# Patient Record
Sex: Female | Born: 1990
Health system: Southern US, Community
[De-identification: ages and names within clinical notes are randomized; demographics above are authoritative.]

## PROBLEM LIST (undated history)

## (undated) ENCOUNTER — Inpatient Hospital Stay (HOSPITAL_COMMUNITY): Payer: Self-pay

## (undated) DIAGNOSIS — A749 Chlamydial infection, unspecified: Secondary | ICD-10-CM

## (undated) DIAGNOSIS — T7840XA Allergy, unspecified, initial encounter: Secondary | ICD-10-CM

## (undated) DIAGNOSIS — A549 Gonococcal infection, unspecified: Secondary | ICD-10-CM

## (undated) DIAGNOSIS — R519 Headache, unspecified: Secondary | ICD-10-CM

## (undated) DIAGNOSIS — R51 Headache: Secondary | ICD-10-CM

## (undated) HISTORY — PX: WISDOM TOOTH EXTRACTION: SHX21

## (undated) HISTORY — PX: TONSILLECTOMY: SUR1361

## (undated) HISTORY — DX: Allergy, unspecified, initial encounter: T78.40XA

---
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
## 2011-08-06 ENCOUNTER — Emergency Department (HOSPITAL_COMMUNITY)
Admission: EM | Admit: 2011-08-06 | Discharge: 2011-08-06 | Disposition: A | Discharge status: Home Health | Payer: Medicaid Other | Attending: Emergency Medicine | Admitting: Emergency Medicine

## 2011-08-06 ENCOUNTER — Encounter (HOSPITAL_COMMUNITY): Payer: Self-pay | Admitting: *Deleted

## 2011-08-06 DIAGNOSIS — R110 Nausea: Secondary | ICD-10-CM | POA: Insufficient documentation

## 2011-08-06 DIAGNOSIS — B9689 Other specified bacterial agents as the cause of diseases classified elsewhere: Secondary | ICD-10-CM | POA: Insufficient documentation

## 2011-08-06 DIAGNOSIS — N72 Inflammatory disease of cervix uteri: Secondary | ICD-10-CM | POA: Insufficient documentation

## 2011-08-06 DIAGNOSIS — R1032 Left lower quadrant pain: Secondary | ICD-10-CM | POA: Insufficient documentation

## 2011-08-06 DIAGNOSIS — N760 Acute vaginitis: Secondary | ICD-10-CM | POA: Insufficient documentation

## 2011-08-06 DIAGNOSIS — A499 Bacterial infection, unspecified: Secondary | ICD-10-CM | POA: Insufficient documentation

## 2011-08-06 DIAGNOSIS — Z79899 Other long term (current) drug therapy: Secondary | ICD-10-CM | POA: Insufficient documentation

## 2011-08-06 DIAGNOSIS — R10814 Left lower quadrant abdominal tenderness: Secondary | ICD-10-CM | POA: Insufficient documentation

## 2011-08-06 LAB — URINALYSIS, ROUTINE W REFLEX MICROSCOPIC
Bilirubin Urine: NEGATIVE
Ketones, ur: NEGATIVE mg/dL
Nitrite: NEGATIVE
Specific Gravity, Urine: 1.037 — ABNORMAL HIGH (ref 1.005–1.030)
Urobilinogen, UA: 0.2 mg/dL (ref 0.0–1.0)

## 2011-08-06 LAB — WET PREP, GENITAL

## 2011-08-06 MED ORDER — TRAMADOL HCL 50 MG PO TABS
50.0000 mg | ORAL_TABLET | Freq: Once | ORAL | Status: AC
Start: 2011-08-06 — End: 2011-08-06
  Administered 2011-08-06: 50 mg via ORAL
  Filled 2011-08-06: qty 1

## 2011-08-06 MED ORDER — METRONIDAZOLE 500 MG PO TABS: 500.0000 mg | ORAL_TABLET | Freq: Two times a day (BID) | ORAL | Status: AC

## 2011-08-06 MED ORDER — TRAMADOL HCL 50 MG PO TABS: 50.0000 mg | ORAL_TABLET | Freq: Four times a day (QID) | ORAL | Status: AC | PRN

## 2011-08-06 MED ORDER — AZITHROMYCIN 250 MG PO TABS
1000.0000 mg | ORAL_TABLET | Freq: Once | ORAL | Status: AC
  Administered 2011-08-06: 1000 mg via ORAL
  Filled 2011-08-06: qty 4

## 2011-08-06 MED ORDER — CEFTRIAXONE SODIUM 250 MG IJ SOLR
250.0000 mg | Freq: Once | INTRAMUSCULAR | Status: AC
  Administered 2011-08-06: 250 mg via INTRAMUSCULAR
  Filled 2011-08-06: qty 250

## 2011-08-06 NOTE — ED Provider Notes (Signed)
History     CSN: [id number]  Arrival date & time 08/06/11  4098   First MD Initiated Contact with Patient 08/06/11 [phone]      Chief Complaint  Patient presents with  . Abdominal Pain    (Consider location/radiation/quality/duration/timing/severity/associated sxs/prior treatment) HPI Comments: Patient presents emergency Department with a three-day history of pain in her left lower quadrant. She states that it's been intermittent but worse over the last 2 days. She denies any UTI symptoms. Denies any vaginal discharge. She just recently had her period which was normal for her. Denies a fevers or chills. She's had some nausea but no vomiting. She has had pain like this in the past at times on the left side at times on the right side. She describes as a dull achy pain in the left lower quadrant it is otherwise nonradiating  The history is provided by the patient.    No past medical history on file.  No past surgical history on file.  No family history on file.  History  Substance Use Topics  . Smoking status: Not on file  . Smokeless tobacco: Not on file  . Alcohol Use: Not on file    OB History    No data available      Review of Systems  Constitutional: Negative for fever, chills, diaphoresis and fatigue.  HENT: Negative for congestion, rhinorrhea and sneezing.   Eyes: Negative.   Respiratory: Negative for cough, chest tightness and shortness of breath.   Cardiovascular: Negative for chest pain and leg swelling.  Gastrointestinal: Positive for abdominal pain. Negative for nausea, vomiting, diarrhea and blood in stool.  Genitourinary: Negative for frequency, hematuria, flank pain, vaginal bleeding, vaginal discharge, difficulty urinating and pelvic pain.  Musculoskeletal: Negative for back pain and arthralgias.  Skin: Negative for rash.  Neurological: Negative for dizziness, speech difficulty, weakness, numbness and headaches.    Allergies  Review of patient's  allergies indicates no known allergies.  Home Medications   Current Outpatient Rx  Name Route Sig Dispense Refill  . METRONIDAZOLE 500 MG PO TABS Oral Take 1 tablet (500 mg total) by mouth 2 (two) times daily. 14 tablet 0  . TRAMADOL HCL 50 MG PO TABS Oral Take 1 tablet (50 mg total) by mouth every 6 (six) hours as needed for pain. 15 tablet 0    BP 116/65  Pulse 106  Temp(Src) 97.6 F (36.4 C) (Oral)  Resp 16  SpO2 100%  Physical Exam  Constitutional: She is oriented to person, place, and time. She appears well-developed and well-nourished.  HENT:  Head: Normocephalic and atraumatic.  Eyes: Pupils are equal, round, and reactive to light.  Neck: Normal range of motion. Neck supple.  Cardiovascular: Normal rate, regular rhythm and normal heart sounds.   Pulmonary/Chest: Effort normal and breath sounds normal. No respiratory distress. She has no wheezes. She has no rales. She exhibits no tenderness.  Abdominal: Soft. Bowel sounds are normal. There is tenderness. There is no rebound and no guarding.       Mild tenderness to the left lower quadrant. No peritoneal signs.  Genitourinary: Vaginal discharge found.       Slight vag discharge, white.  No CMT, slight left adnexal tenderness  Musculoskeletal: Normal range of motion. She exhibits no edema.  Lymphadenopathy:    She has no cervical adenopathy.  Neurological: She is alert and oriented to person, place, and time.  Skin: Skin is warm and dry. No rash noted.  Psychiatric: She has a  normal mood and affect.    ED Course  Procedures (including critical care time)  Results for orders placed during the hospital encounter of 08/06/11  URINALYSIS, ROUTINE W REFLEX MICROSCOPIC      Component Value Range   Color, Urine YELLOW  YELLOW    APPearance CLEAR  CLEAR    Specific Gravity, Urine 1.037 (*) 1.005 - 1.030    pH 6.5  5.0 - 8.0    Glucose, UA NEGATIVE  NEGATIVE (mg/dL)   Hgb urine dipstick NEGATIVE  NEGATIVE    Bilirubin  Urine NEGATIVE  NEGATIVE    Ketones, ur NEGATIVE  NEGATIVE (mg/dL)   Protein, ur NEGATIVE  NEGATIVE (mg/dL)   Urobilinogen, UA 0.2  0.0 - 1.0 (mg/dL)   Nitrite NEGATIVE  NEGATIVE    Leukocytes, UA NEGATIVE  NEGATIVE   PREGNANCY, URINE      Component Value Range   Preg Test, Ur NEGATIVE  NEGATIVE   WET PREP, GENITAL      Component Value Range   Yeast Wet Prep HPF POC NONE SEEN  NONE SEEN    Trich, Wet Prep NONE SEEN  NONE SEEN    Clue Cells Wet Prep HPF POC FEW (*) NONE SEEN    WBC, Wet Prep HPF POC FEW (*) NONE SEEN    No results found.   No results found.   1. Bacterial vaginosis   2. Cervicitis       MDM   patient with vaginal discharge and left side adnexal pain. The pain is very mild and is not suggestive of ovarian torsion. The clinical picture did not suggest tubo-ovarian abscess. It could possibly represent an ovarian cyst, however the patient is very comfortable at this point and I do not feel that emergent imaging is indicated tonight. I will go ahead and treat the patient for cervicitis refer to OB/GYN and return here as needed for worsening symptoms        Rolan Bucco, MD 08/06/11 [phone]

## 2011-08-06 NOTE — ED Notes (Signed)
Pt is C/O ABD pain starting in the far LUQ and radiating around to RUQ.

## 2011-08-06 NOTE — Discharge Instructions (Signed)
Cervicitis Cervicitis is a soreness and swelling (inflammation) of the cervix. Your cervix is located at the bottom of your uterus which opens up to the vagina.  CAUSES   Sexually transmitted infections (STIs).   Allergic reaction.   Medicines or birth control devices that are put in the vagina.   Injury to the cervix.   Bacterial infections.  SYMPTOMS  There may be no symptoms. If symptoms occur, they may include:  Grey, white, yellow, or bad smelling vaginal discharge.   Pain or itching of the area outside the vagina.   Painful sexual intercourse.   Lower abdominal or lower back pain, especially during intercourse.   Frequent urination.   Abnormal vaginal bleeding between periods, after sexual intercourse, or after menopause.   Pressure or a heavy feeling in the pelvis.  DIAGNOSIS  Diagnosis is made after a pelvic exam. Other tests may include:  Examination of any discharge under a microscope (wet prep).   A Pap test.  TREATMENT  Treatment will depend on the cause of cervicitis. If it is caused by an STI, both you and your partner will need to be treated. Antibiotic medicines will be given. HOME CARE INSTRUCTIONS   Do not have sexual intercourse until your caregiver says it is okay.   Do not have sexual intercourse until your partner has been treated if your cervicitis is caused by an STI.   Take your antibiotics as directed. Finish them even if you start to feel better.  SEEK IMMEDIATE MEDICAL CARE IF:   Your symptoms come back.   You have a fever.   You experience any problems that may be related to the medicine you are taking.  MAKE SURE YOU:   Understand these instructions.   Will watch your condition.   Will get help right away if you are not doing well or get worse.  Document Released: 05/30/2005 Document Revised: 02/09/2011 Document Reviewed: 12/27/2010 [facility] Patient Information 2012 [place], [place].Bacterial Vaginosis Bacterial vaginosis is an  infection of the vagina. A healthy vagina has many kinds of good germs (bacteria). Sometimes the number of good germs can change. This allows bad germs to move in and cause an infection. You may be given medicine (antibiotics) to treat the infection. Or, you may not need treatment at all. HOME CARE  Take your medicine as told. Finish them even if you start to feel better.   Do not have sex until you finish your medicine.   Do not douche.   Practice safe sex.   Tell your sex partner that you have an infection. They should see their doctor for treatment if they have problems.  GET HELP RIGHT AWAY IF:  You do not get better after 3 days of treatment.   You have grey fluid (discharge) coming from your vagina.   You have pain.   You have a temperature of 102 F (38.9 C) or higher.  MAKE SURE YOU:   Understand these instructions.   Will watch your condition.   Will get help right away if you are not doing well or get worse.  Document Released: 03/08/2008 Document Revised: 02/09/2011 Document Reviewed: 03/08/2008 [facility] Patient Information 2012 [place], [place].

## 2011-08-08 LAB — GC/CHLAMYDIA PROBE AMP, GENITAL: GC Probe Amp, Genital: NEGATIVE

## 2012-02-24 ENCOUNTER — Inpatient Hospital Stay (HOSPITAL_COMMUNITY)
Admission: AD | Admit: 2012-02-24 | Discharge: 2012-02-24 | Disposition: A | Discharge status: Home / Self Care | Payer: Medicaid Other | Source: Ambulatory Visit | Attending: Obstetrics & Gynecology | Admitting: Obstetrics & Gynecology

## 2012-02-24 ENCOUNTER — Encounter (HOSPITAL_COMMUNITY): Payer: Self-pay | Admitting: *Deleted

## 2012-02-24 DIAGNOSIS — N939 Abnormal uterine and vaginal bleeding, unspecified: Secondary | ICD-10-CM

## 2012-02-24 DIAGNOSIS — N926 Irregular menstruation, unspecified: Secondary | ICD-10-CM

## 2012-02-24 LAB — URINE MICROSCOPIC-ADD ON

## 2012-02-24 LAB — URINALYSIS, ROUTINE W REFLEX MICROSCOPIC
Leukocytes, UA: NEGATIVE
Nitrite: NEGATIVE
Specific Gravity, Urine: 1.02 (ref 1.005–1.030)
Urobilinogen, UA: 0.2 mg/dL (ref 0.0–1.0)

## 2012-02-24 LAB — WET PREP, GENITAL
Clue Cells Wet Prep HPF POC: NONE SEEN
WBC, Wet Prep HPF POC: NONE SEEN

## 2012-02-24 LAB — POCT PREGNANCY, URINE: Preg Test, Ur: NEGATIVE

## 2012-02-24 MED ORDER — NORGESTIMATE-ETH ESTRADIOL 0.25-35 MG-MCG PO TABS: 1.0000 | ORAL_TABLET | Freq: Every day | ORAL | Status: DC

## 2012-02-24 NOTE — MAU Note (Signed)
Patient states she has been having irregular periods since her last Depo about one to two years ago. Bleeding became heavy and abdominal pain got worse last night.

## 2012-02-24 NOTE — MAU Provider Note (Signed)
I was present for the exam and agree with above.  [place], CNM 02/24/2012 3:00 PM

## 2012-02-24 NOTE — MAU Provider Note (Signed)
History     CSN: [id number]  Arrival date and time: 02/24/12 1059   First Provider Initiated Contact with Patient 02/24/12 1209      Chief Complaint  Patient presents with  . Vaginal Bleeding  . Abdominal Pain   HPI  Patient is a 21 y.o. AA female presenting to the MAU today with a cc of irregular periods for the past 1+ year.  Patient states that since coming off of her Depo Provera about 1-1.5 years ago she has not had a regular period.  She had normal menstrual cycles prior to the initiation of Depo Provera.  Over the past few months she has had irregular vaginal bleeding in which she has a Period amount of bleeding for a couple of days, then no bleeding for 2-3 days followed by another episode of period bleeding.  She states that over the past few days the flow has increased in amount and she has had significant lower abdominal cramping/discomfort.  Patient endorses seeing clots in the blood sometimes.  She denies any dysuria, hematuria, urinary frequency, abnormal vaginal discharge.  She is currently sexually active with no contraceptive use.  Urine pregnancy was negative today.  She denies any concerns of STI nor any hx of STI.  OB History    Grav Para Term Preterm Abortions TAB SAB Ect Mult Living                  History reviewed. No pertinent past medical history.  Past Surgical History  Procedure Date  . Tonsillectomy     History reviewed. No pertinent family history.  History  Substance Use Topics  . Smoking status: Current Every Day Smoker  . Smokeless tobacco: Not on file  . Alcohol Use: No    Allergies: No Known Allergies  Prescriptions prior to admission  Medication Sig Dispense Refill  . Pseudoephedrine-Ibuprofen (ADVIL COLD & SINUS LIQUI-GELS) 30-200 MG CAPS Take 1 tablet by mouth 2 (two) times daily as needed. For congestion/headache        Review of Systems  Constitutional: Negative.   Respiratory: Negative.   Cardiovascular: Negative.     Gastrointestinal: Positive for abdominal pain. Negative for heartburn, nausea, vomiting, diarrhea, constipation, blood in stool and melena.  Genitourinary: Negative.   Musculoskeletal: Negative.    Physical Exam   Blood pressure 122/77, pulse 86, temperature 99.3 F (37.4 C), temperature source Oral, resp. rate 16, height 5' 6.5" (1.689 m), weight 74.39 kg (164 lb), last menstrual period 02/23/2012, SpO2 100.00%.  Physical Exam  Physical Exam  General - WD, WN AA female in NAD sitting comfortably on examination table  Heart - RRR; S1/S2 distinct without murmur; no S3/S4; no rubs, clicks or gallops  Lungs - CTAB; no use of accessory muscles noted  Abdomen - BS +; S/NT/ND; mild suprapubic discomfort with light and deep palpation Pelvic Exam - closed cervix; presence of blood within the vaginal canal and cervical vault; no adnexal masses or CMT with bimanual examination; No lesions or swelling of external genital structures. Skin - presence of a 2 cm boil on the left posterior thigh, adjacent to left buttock    MAU Course  Procedures  Labs: - UA -- positive blood - Urine Pregnancy -- negative - Vaginal Wet Prep -- normal - G/C -- results pending   Assessment and Plan  (1) Anovulatory Bleeding   -- Will prescribe 3 months of OCPs.  Patient given list of local OB/GYN providers and clinics.  Instructed to schedule appointment  with a provider for routine gynecological care and to get a loner prescriptions of OCPs  Marcelline Mates 02/24/2012, 12:30 PM

## 2012-02-27 LAB — GC/CHLAMYDIA PROBE AMP, GENITAL: Chlamydia, DNA Probe: NEGATIVE

## 2012-06-14 ENCOUNTER — Encounter (HOSPITAL_COMMUNITY): Payer: Self-pay | Admitting: Emergency Medicine

## 2012-06-14 ENCOUNTER — Emergency Department (HOSPITAL_COMMUNITY)
Admission: EM | Admit: 2012-06-14 | Discharge: 2012-06-14 | Disposition: A | Discharge status: Home / Self Care | Payer: Self-pay | Attending: Emergency Medicine | Admitting: Emergency Medicine

## 2012-06-14 DIAGNOSIS — R221 Localized swelling, mass and lump, neck: Secondary | ICD-10-CM | POA: Insufficient documentation

## 2012-06-14 DIAGNOSIS — F17200 Nicotine dependence, unspecified, uncomplicated: Secondary | ICD-10-CM | POA: Insufficient documentation

## 2012-06-14 DIAGNOSIS — T495X1A Poisoning by ophthalmological drugs and preparations, accidental (unintentional), initial encounter: Secondary | ICD-10-CM | POA: Insufficient documentation

## 2012-06-14 DIAGNOSIS — T6591XA Toxic effect of unspecified substance, accidental (unintentional), initial encounter: Secondary | ICD-10-CM

## 2012-06-14 DIAGNOSIS — Y9389 Activity, other specified: Secondary | ICD-10-CM | POA: Insufficient documentation

## 2012-06-14 DIAGNOSIS — T490X1A Poisoning by local antifungal, anti-infective and anti-inflammatory drugs, accidental (unintentional), initial encounter: Secondary | ICD-10-CM | POA: Insufficient documentation

## 2012-06-14 DIAGNOSIS — Z79899 Other long term (current) drug therapy: Secondary | ICD-10-CM | POA: Insufficient documentation

## 2012-06-14 DIAGNOSIS — Y929 Unspecified place or not applicable: Secondary | ICD-10-CM | POA: Insufficient documentation

## 2012-06-14 DIAGNOSIS — R220 Localized swelling, mass and lump, head: Secondary | ICD-10-CM | POA: Insufficient documentation

## 2012-06-14 NOTE — ED Provider Notes (Signed)
History     CSN: [id number]  Arrival date & time 06/14/12  0705   First MD Initiated Contact with Patient 06/14/12 [phone]      Chief Complaint  Patient presents with  . Abdominal Pain  . Oral Swelling    (Consider location/radiation/quality/duration/timing/severity/associated sxs/prior treatment) HPI Pt presents with c/o accidentally brushing her teeth with hydrocortisone cream.  She states she realized her mistake when she started brushing and spit it out of her mouth.  She did not swallow much if any of it.  She now c/o feeling that her gums are swelling and diffuse cramping abdominal pain.  No difficulty breathing or swallowing.  No vomiting. No treatment prior to arrival.  There are no other associated systemic symptoms, there are no other alleviating or modifying factors.   History reviewed. No pertinent past medical history.  Past Surgical History  Procedure Date  . Tonsillectomy     No family history on file.  History  Substance Use Topics  . Smoking status: Current Every Day Smoker  . Smokeless tobacco: Not on file  . Alcohol Use: No    OB History    Grav Para Term Preterm Abortions TAB SAB Ect Mult Living                  Review of Systems ROS reviewed and all otherwise negative except for mentioned in HPI  Allergies  Review of patient's allergies indicates no known allergies.  Home Medications   Current Outpatient Rx  Name  Route  Sig  Dispense  Refill  . NORGESTIMATE-ETH ESTRADIOL 0.25-35 MG-MCG PO TABS   Oral   Take 1 tablet by mouth daily.   1 Package   3   . PSEUDOEPHEDRINE-IBUPROFEN 30-200 MG PO CAPS   Oral   Take 1 tablet by mouth 2 (two) times daily as needed. For congestion/headache           BP 125/80  Pulse 94  Temp 97.9 F (36.6 C) (Oral)  Resp 16  SpO2 99%  LMP 05/09/2012 Vitals reviewed Physical Exam Physical Examination: General appearance - alert, well appearing, and in no distress Mental status - alert, oriented to  person, place, and time Eyes - no conjunctival injection, no scleral icterus Mouth - mucous membranes moist, pharynx normal without lesions Neck - supple, no significant adenopathy Chest - clear to auscultation, no wheezes, rales or rhonchi, symmetric air entry Heart - normal rate, regular rhythm, normal S1, S2, no murmurs, rubs, clicks or gallops Abdomen - soft, nontender, nondistended, no masses or organomegaly, nabs Extremities - peripheral pulses normal, no pedal edema, no clubbing or cyanosis Skin - normal coloration and turgor, no rashes  ED Course  Procedures (including critical care time)  Labs Reviewed - No data to display No results found.   1. Accidental ingestion of substance       MDM  Pt presenting with concern for irritation of mouth after brushing teeth with hydrocortisone. No vomiting, abdomen benign.  Low concern for toxic effects.  D/w poison control as well and they state there is no treatment necessary and this exposure was benign.  Pt reassured.  Discharged with strict return precautions.  Pt agreeable with plan.        Ethelda Chick, MD 06/14/12 [phone]

## 2012-06-14 NOTE — ED Notes (Signed)
States that she accidentally brushed her teeth with cortisone. Complaints of mouth swelling and abd pain. No swelling noted, no n/v

## 2012-09-26 ENCOUNTER — Inpatient Hospital Stay (HOSPITAL_COMMUNITY)
Admission: AD | Admit: 2012-09-26 | Discharge: 2012-09-26 | Disposition: A | Discharge status: Home / Self Care | Payer: Self-pay | Source: Ambulatory Visit | Attending: Obstetrics and Gynecology | Admitting: Obstetrics and Gynecology

## 2012-09-26 ENCOUNTER — Encounter (HOSPITAL_COMMUNITY): Payer: Self-pay | Admitting: *Deleted

## 2012-09-26 DIAGNOSIS — N912 Amenorrhea, unspecified: Secondary | ICD-10-CM | POA: Insufficient documentation

## 2012-09-26 DIAGNOSIS — Z3202 Encounter for pregnancy test, result negative: Secondary | ICD-10-CM | POA: Insufficient documentation

## 2012-09-26 HISTORY — DX: Chlamydial infection, unspecified: A74.9

## 2012-09-26 HISTORY — DX: Gonococcal infection, unspecified: A54.9

## 2012-09-26 LAB — POCT PREGNANCY, URINE: Preg Test, Ur: NEGATIVE

## 2012-09-26 NOTE — MAU Note (Signed)
Patient states she is unsure of her last period, has been "awhile". Denies pain or bleeding.

## 2012-09-26 NOTE — Progress Notes (Signed)
States she does not need anything today except pregnancy test. Declines pelvic exam.

## 2012-09-26 NOTE — MAU Provider Note (Signed)
  History     CSN: [id number]  Arrival date and time: 09/26/12 1444   First Provider Initiated Contact with Patient 09/26/12 1522      Chief Complaint  Patient presents with  . Possible Pregnancy   HPI Emily Lowery is 22 y.o. G0P0 Unknown weeks presenting with request for pregnancy test.  LMP ?  Maybe several months ago.  Hx of irregular cycles.  Sexually active, condoms.  Tired a lot.  Denies breast tenderness.  Nausea occ.     Past Medical History  Diagnosis Date  . Gonorrhea   . Chlamydia     Past Surgical History  Procedure Laterality Date  . Tonsillectomy    . Wisdom tooth extraction      History reviewed. No pertinent family history.  History  Substance Use Topics  . Smoking status: Current Every Day Smoker    Types: Cigarettes  . Smokeless tobacco: Not on file  . Alcohol Use: Yes     Comment: occas.    Allergies: No Known Allergies  Prescriptions prior to admission  Medication Sig Dispense Refill  . norgestimate-ethinyl estradiol (SPRINTEC 28) 0.25-35 MG-MCG tablet Take 1 tablet by mouth daily.  1 Package  3  . Pseudoephedrine-Ibuprofen (ADVIL COLD & SINUS LIQUI-GELS) 30-200 MG CAPS Take 1 tablet by mouth 2 (two) times daily as needed. For congestion/headache        Review of Systems  Constitutional: Positive for malaise/fatigue.  Gastrointestinal: Positive for nausea (occ). Negative for vomiting and abdominal pain.  Genitourinary:       Amenorrhea   Physical Exam   Blood pressure 120/76, pulse 89, temperature 98.1 F (36.7 C), temperature source Oral, resp. rate 16, SpO2 100.00%.  Physical Exam  Constitutional: She is oriented to person, place, and time. She appears well-developed and well-nourished. No distress.  HENT:  Head: Normocephalic.  Neurological: She is alert and oriented to person, place, and time.  Psychiatric: She has a normal mood and affect. Her behavior is normal.   Results for orders placed during the hospital encounter of  09/26/12 (from the past 24 hour(s))  POCT PREGNANCY, URINE     Status: None   Collection Time    09/26/12  3:06 PM      Result Value Range   Preg Test, Ur NEGATIVE  NEGATIVE   MAU Course  Procedures  MDM  Patient states she is using condoms for birth control.  Depo and OCPs were not good options for her.   Assessment and Plan  A:  Amenorrhea     Negative pregnancy test  P:  Continue condoms       Encouraged patient to find doctor for routine GYN care  Krew Hortman,EVE M 09/26/2012, 3:24 PM

## 2012-09-27 NOTE — MAU Provider Note (Signed)
Attestation of Attending Supervision of Advanced Practitioner (CNM/NP): Evaluation and management procedures were performed by the Advanced Practitioner under my supervision and collaboration.  I have reviewed the Advanced Practitioner's note and chart, and I agree with the management and plan.  Ravleen Ries 09/27/2012 12:56 PM

## 2013-03-25 ENCOUNTER — Emergency Department (HOSPITAL_COMMUNITY)
Admission: EM | Admit: 2013-03-25 | Discharge: 2013-03-25 | Disposition: A | Discharge status: Home / Self Care | Payer: BC Managed Care – PPO | Attending: Emergency Medicine | Admitting: Emergency Medicine

## 2013-03-25 ENCOUNTER — Encounter (HOSPITAL_COMMUNITY): Payer: Self-pay | Admitting: Emergency Medicine

## 2013-03-25 DIAGNOSIS — R109 Unspecified abdominal pain: Secondary | ICD-10-CM

## 2013-03-25 DIAGNOSIS — R110 Nausea: Secondary | ICD-10-CM | POA: Insufficient documentation

## 2013-03-25 DIAGNOSIS — R51 Headache: Secondary | ICD-10-CM | POA: Insufficient documentation

## 2013-03-25 DIAGNOSIS — F17200 Nicotine dependence, unspecified, uncomplicated: Secondary | ICD-10-CM | POA: Insufficient documentation

## 2013-03-25 DIAGNOSIS — Z3202 Encounter for pregnancy test, result negative: Secondary | ICD-10-CM | POA: Insufficient documentation

## 2013-03-25 DIAGNOSIS — H53149 Visual discomfort, unspecified: Secondary | ICD-10-CM | POA: Insufficient documentation

## 2013-03-25 DIAGNOSIS — Z8619 Personal history of other infectious and parasitic diseases: Secondary | ICD-10-CM | POA: Insufficient documentation

## 2013-03-25 LAB — URINALYSIS, ROUTINE W REFLEX MICROSCOPIC
Bilirubin Urine: NEGATIVE
Ketones, ur: NEGATIVE mg/dL
Nitrite: NEGATIVE
Urobilinogen, UA: 1 mg/dL (ref 0.0–1.0)
pH: 6 (ref 5.0–8.0)

## 2013-03-25 LAB — URINE MICROSCOPIC-ADD ON

## 2013-03-25 MED ORDER — OXYCODONE-ACETAMINOPHEN 5-325 MG PO TABS
1.0000 | ORAL_TABLET | Freq: Once | ORAL | Status: AC
  Administered 2013-03-25: 1 via ORAL
  Filled 2013-03-25: qty 1

## 2013-03-25 MED ORDER — SODIUM CHLORIDE 0.9 % IV BOLUS (SEPSIS)
1000.0000 mL | Freq: Once | INTRAVENOUS | Status: AC
  Administered 2013-03-25: 1000 mL via INTRAVENOUS

## 2013-03-25 MED ORDER — IBUPROFEN 600 MG PO TABS: 600.0000 mg | ORAL_TABLET | Freq: Four times a day (QID) | ORAL | Status: DC | PRN

## 2013-03-25 MED ORDER — ONDANSETRON 8 MG PO TBDP: 8.0000 mg | ORAL_TABLET | Freq: Three times a day (TID) | ORAL | Status: DC | PRN

## 2013-03-25 MED ORDER — ONDANSETRON HCL 4 MG/2ML IJ SOLN
4.0000 mg | Freq: Once | INTRAMUSCULAR | Status: AC
  Administered 2013-03-25: 4 mg via INTRAVENOUS
  Filled 2013-03-25: qty 2

## 2013-03-25 NOTE — ED Notes (Signed)
Pt states that migraine, nausea, abd pain and dry mouth started yesterday.  Pt states that she has had ginger ale.

## 2013-03-25 NOTE — ED Provider Notes (Signed)
CSN: [id number]     Arrival date & time 03/25/13  0865 History   First MD Initiated Contact with Patient 03/25/13 1014     Chief Complaint  Patient presents with  . Migraine  . Abdominal Pain  . Nausea    The history is provided by the patient.   patient reports developing nausea and intermittent lower abdominal pain as well as dry mouth with associated migraine headache over the past 24 hours.  She denies fevers and chills.  She reports she has a history of headaches and states this feels similar.  She has some photophobia.  She reports nausea without vomiting.  She denies melena or hematochezia.  No hematemesis.  She reports intermittent lower abdominal pain.  She denies dysuria or urinary frequency.  No change in her bowel movements.  She denies upper abdominal pain.  She states sometimes lower abdominal pain seems to worsen by food.  Her symptoms are mild in severity.  She states at this time she has a significant abdominal pain  Past Medical History  Diagnosis Date  . Gonorrhea   . Chlamydia    Past Surgical History  Procedure Laterality Date  . Tonsillectomy    . Wisdom tooth extraction     No family history on file. History  Substance Use Topics  . Smoking status: Current Every Day Smoker    Types: Cigarettes  . Smokeless tobacco: Not on file  . Alcohol Use: Yes     Comment: occas.   OB History   Grav Para Term Preterm Abortions TAB SAB Ect Mult Living   0              Review of Systems  All other systems reviewed and are negative.    Allergies  Review of patient's allergies indicates no known allergies.  Home Medications   Current Outpatient Rx  Name  Route  Sig  Dispense  Refill  . ibuprofen (ADVIL,MOTRIN) 600 MG tablet   Oral   Take 1 tablet (600 mg total) by mouth every 6 (six) hours as needed for pain.   30 tablet   0   . ondansetron (ZOFRAN ODT) 8 MG disintegrating tablet   Oral   Take 1 tablet (8 mg total) by mouth every 8 (eight) hours as needed  for nausea.   10 tablet   0    BP 118/64  Pulse 87  Temp(Src) 98.4 F (36.9 C) (Oral)  Resp 16  SpO2 100%  LMP 03/18/2013 Physical Exam  Nursing note and vitals reviewed. Constitutional: She is oriented to person, place, and time. She appears well-developed and well-nourished. No distress.  HENT:  Head: Normocephalic and atraumatic.  Eyes: EOM are normal.  Neck: Normal range of motion.  Cardiovascular: Normal rate, regular rhythm and normal heart sounds.   Pulmonary/Chest: Effort normal and breath sounds normal.  Abdominal: Soft. She exhibits no distension. There is no tenderness.  Musculoskeletal: Normal range of motion.  Neurological: She is alert and oriented to person, place, and time.  Skin: Skin is warm and dry.  Psychiatric: She has a normal mood and affect. Judgment normal.    ED Course  Procedures (including critical care time) Labs Review Labs Reviewed  URINALYSIS, ROUTINE W REFLEX MICROSCOPIC - Abnormal; Notable for the following:    APPearance CLOUDY (*)    Specific Gravity, Urine 1.037 (*)    Leukocytes, UA SMALL (*)    All other components within normal limits  URINE MICROSCOPIC-ADD ON - Abnormal; Notable  for the following:    Squamous Epithelial / LPF FEW (*)    Bacteria, UA FEW (*)    All other components within normal limits  URINE CULTURE  PREGNANCY, URINE   Imaging Review No results found.  EKG Interpretation   None       MDM   1. Abdominal pain   2. Headache    11:41 AM  Patient feels much better this time.  Discharge home.  Urine without signs of infection.  Headache improved.  Normal neurologic exam.  No indication for imaging.  Repeat abdominal exam is benign.    Lyanne Co, MD 03/25/13 [phone]

## 2013-03-26 LAB — URINE CULTURE

## 2013-09-25 ENCOUNTER — Encounter (HOSPITAL_COMMUNITY): Payer: Self-pay | Admitting: Emergency Medicine

## 2013-09-25 ENCOUNTER — Emergency Department (HOSPITAL_COMMUNITY): Payer: Medicaid Other

## 2013-09-25 ENCOUNTER — Emergency Department (HOSPITAL_COMMUNITY)
Admission: EM | Admit: 2013-09-25 | Discharge: 2013-09-25 | Disposition: A | Discharge status: Home / Self Care | Payer: Medicaid Other | Attending: Emergency Medicine | Admitting: Emergency Medicine

## 2013-09-25 DIAGNOSIS — S335XXA Sprain of ligaments of lumbar spine, initial encounter: Secondary | ICD-10-CM | POA: Insufficient documentation

## 2013-09-25 DIAGNOSIS — Y92410 Unspecified street and highway as the place of occurrence of the external cause: Secondary | ICD-10-CM | POA: Insufficient documentation

## 2013-09-25 DIAGNOSIS — Y9389 Activity, other specified: Secondary | ICD-10-CM | POA: Insufficient documentation

## 2013-09-25 DIAGNOSIS — S139XXA Sprain of joints and ligaments of unspecified parts of neck, initial encounter: Secondary | ICD-10-CM | POA: Insufficient documentation

## 2013-09-25 DIAGNOSIS — F17200 Nicotine dependence, unspecified, uncomplicated: Secondary | ICD-10-CM | POA: Insufficient documentation

## 2013-09-25 DIAGNOSIS — S39012A Strain of muscle, fascia and tendon of lower back, initial encounter: Secondary | ICD-10-CM

## 2013-09-25 DIAGNOSIS — Z8619 Personal history of other infectious and parasitic diseases: Secondary | ICD-10-CM | POA: Insufficient documentation

## 2013-09-25 DIAGNOSIS — S161XXA Strain of muscle, fascia and tendon at neck level, initial encounter: Secondary | ICD-10-CM

## 2013-09-25 MED ORDER — HYDROCODONE-ACETAMINOPHEN 5-325 MG PO TABS
1.0000 | ORAL_TABLET | Freq: Once | ORAL | Status: AC
  Administered 2013-09-25: 1 via ORAL
  Filled 2013-09-25: qty 1

## 2013-09-25 MED ORDER — NAPROXEN 500 MG PO TABS: 500.0000 mg | ORAL_TABLET | Freq: Two times a day (BID) | ORAL | Status: DC

## 2013-09-25 MED ORDER — CYCLOBENZAPRINE HCL 10 MG PO TABS: 10.0000 mg | ORAL_TABLET | Freq: Two times a day (BID) | ORAL | Status: DC | PRN

## 2013-09-25 MED ORDER — HYDROCODONE-ACETAMINOPHEN 5-325 MG PO TABS: 1.0000 | ORAL_TABLET | ORAL | Status: DC | PRN

## 2013-09-25 MED ORDER — IBUPROFEN 800 MG PO TABS
800.0000 mg | ORAL_TABLET | Freq: Once | ORAL | Status: AC
Start: 2013-09-25 — End: 2013-09-25
  Administered 2013-09-25: 800 mg via ORAL
  Filled 2013-09-25: qty 1
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
## 2013-09-25 NOTE — Progress Notes (Signed)
[facility] provided pt with a list of primary care resources to help patient establish primary care.  °
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
## 2013-09-25 NOTE — ED Provider Notes (Signed)
Medical screening examination/treatment/procedure(s) were performed by non-physician practitioner and as supervising physician I was immediately available for consultation/collaboration.   EKG Interpretation None       Ethelda ChickMartha K Linker, MD 09/25/13 1556

## 2014-05-06 ENCOUNTER — Encounter (HOSPITAL_COMMUNITY): Payer: Self-pay | Admitting: Emergency Medicine

## 2014-05-06 ENCOUNTER — Emergency Department (HOSPITAL_COMMUNITY)
Admission: EM | Admit: 2014-05-06 | Discharge: 2014-05-06 | Disposition: A | Discharge status: Home / Self Care | Payer: No Typology Code available for payment source | Attending: Emergency Medicine | Admitting: Emergency Medicine

## 2014-05-06 DIAGNOSIS — Y9289 Other specified places as the place of occurrence of the external cause: Secondary | ICD-10-CM | POA: Insufficient documentation

## 2014-05-06 DIAGNOSIS — Y9389 Activity, other specified: Secondary | ICD-10-CM | POA: Diagnosis not present

## 2014-05-06 DIAGNOSIS — Z720 Tobacco use: Secondary | ICD-10-CM | POA: Insufficient documentation

## 2014-05-06 DIAGNOSIS — X12XXXA Contact with other hot fluids, initial encounter: Secondary | ICD-10-CM | POA: Diagnosis not present

## 2014-05-06 DIAGNOSIS — Z8619 Personal history of other infectious and parasitic diseases: Secondary | ICD-10-CM | POA: Diagnosis not present

## 2014-05-06 DIAGNOSIS — T23161A Burn of first degree of back of right hand, initial encounter: Secondary | ICD-10-CM | POA: Diagnosis not present

## 2014-05-06 DIAGNOSIS — T23101A Burn of first degree of right hand, unspecified site, initial encounter: Secondary | ICD-10-CM

## 2014-05-06 DIAGNOSIS — Z791 Long term (current) use of non-steroidal anti-inflammatories (NSAID): Secondary | ICD-10-CM | POA: Insufficient documentation

## 2014-05-06 DIAGNOSIS — Z23 Encounter for immunization: Secondary | ICD-10-CM | POA: Diagnosis not present

## 2014-05-06 DIAGNOSIS — Y990 Civilian activity done for income or pay: Secondary | ICD-10-CM | POA: Diagnosis not present

## 2014-05-06 DIAGNOSIS — T23001A Burn of unspecified degree of right hand, unspecified site, initial encounter: Secondary | ICD-10-CM | POA: Diagnosis present

## 2014-05-06 MED ORDER — ACETAMINOPHEN 500 MG PO TABS: 500.0000 mg | ORAL_TABLET | Freq: Four times a day (QID) | ORAL | Status: DC | PRN

## 2014-05-06 MED ORDER — NAPROXEN 500 MG PO TABS: 500.0000 mg | ORAL_TABLET | Freq: Two times a day (BID) | ORAL | Status: DC

## 2014-05-06 MED ORDER — TETANUS-DIPHTH-ACELL PERTUSSIS 5-2.5-18.5 LF-MCG/0.5 IM SUSP
0.5000 mL | Freq: Once | INTRAMUSCULAR | Status: AC
  Administered 2014-05-06: 0.5 mL via INTRAMUSCULAR
  Filled 2014-05-06: qty 0.5
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
## 2014-07-13 ENCOUNTER — Encounter (HOSPITAL_COMMUNITY): Payer: Self-pay | Admitting: *Deleted

## 2014-07-13 ENCOUNTER — Inpatient Hospital Stay (HOSPITAL_COMMUNITY)
Admission: AD | Admit: 2014-07-13 | Discharge: 2014-07-13 | Disposition: A | Discharge status: Home / Self Care | Payer: No Typology Code available for payment source | Source: Ambulatory Visit | Attending: Family Medicine | Admitting: Family Medicine

## 2014-07-13 DIAGNOSIS — N760 Acute vaginitis: Secondary | ICD-10-CM

## 2014-07-13 DIAGNOSIS — Z87891 Personal history of nicotine dependence: Secondary | ICD-10-CM | POA: Insufficient documentation

## 2014-07-13 DIAGNOSIS — A499 Bacterial infection, unspecified: Secondary | ICD-10-CM

## 2014-07-13 DIAGNOSIS — B9689 Other specified bacterial agents as the cause of diseases classified elsewhere: Secondary | ICD-10-CM | POA: Diagnosis not present

## 2014-07-13 DIAGNOSIS — N939 Abnormal uterine and vaginal bleeding, unspecified: Secondary | ICD-10-CM | POA: Diagnosis present

## 2014-07-13 LAB — URINALYSIS, ROUTINE W REFLEX MICROSCOPIC
Bilirubin Urine: NEGATIVE
GLUCOSE, UA: NEGATIVE mg/dL
Ketones, ur: NEGATIVE mg/dL
LEUKOCYTES UA: NEGATIVE
Nitrite: NEGATIVE
PH: 5.5 (ref 5.0–8.0)
Protein, ur: NEGATIVE mg/dL
Urobilinogen, UA: 0.2 mg/dL (ref 0.0–1.0)

## 2014-07-13 LAB — URINE MICROSCOPIC-ADD ON

## 2014-07-13 LAB — WET PREP, GENITAL
TRICH WET PREP: NONE SEEN
Yeast Wet Prep HPF POC: NONE SEEN

## 2014-07-13 LAB — POCT PREGNANCY, URINE: PREG TEST UR: NEGATIVE

## 2014-07-13 MED ORDER — METRONIDAZOLE 500 MG PO TABS: 500.0000 mg | ORAL_TABLET | Freq: Two times a day (BID) | ORAL | Status: DC

## 2014-07-13 NOTE — MAU Note (Signed)
Pt fell asleep in Triage while obtaining information. Easily arouses

## 2014-07-13 NOTE — MAU Provider Note (Signed)
History     CSN: [id number]  Arrival date and time: 07/13/14 0307   First Provider Initiated Contact with Patient 07/13/14 [phone]      Chief Complaint  Patient presents with  . Vaginal Bleeding   HPI  24 yo female here with report of intermittent white discharge and vaginal itching x two days.  Reports smelling slight odor.  Also reports bleeding that started one week ago, lasted two days that returned yesterday.  ;Bleeding is described as light.  Also reports occasional mid pelvic cramping.   Past Medical History  Diagnosis Date  . Gonorrhea   . Chlamydia   . Medical history non-contributory     Past Surgical History  Procedure Laterality Date  . Tonsillectomy    . Wisdom tooth extraction      Family History  Problem Relation Age of Onset  . Diabetes Mother   . Diabetes Sister   . Diabetes Maternal Grandmother     History  Substance Use Topics  . Smoking status: Former Smoker    Types: Cigarettes  . Smokeless tobacco: Not on file  . Alcohol Use: Yes     Comment: occas.    Allergies: No Known Allergies  Prescriptions prior to admission  Medication Sig Dispense Refill Last Dose  . acetaminophen (TYLENOL) 500 MG tablet Take 1 tablet (500 mg total) by mouth every 6 (six) hours as needed. 30 tablet 0 Past Month at Unknown time  . cyclobenzaprine (FLEXERIL) 10 MG tablet Take 1 tablet (10 mg total) by mouth 2 (two) times daily as needed for muscle spasms. 20 tablet 0   . HYDROcodone-acetaminophen (NORCO/VICODIN) 5-325 MG per tablet Take 1 tablet by mouth every 4 (four) hours as needed. 15 tablet 0   . ibuprofen (ADVIL,MOTRIN) 600 MG tablet Take 1 tablet (600 mg total) by mouth every 6 (six) hours as needed for pain. 30 tablet 0 More than a month at Unknown time  . naproxen (NAPROSYN) 500 MG tablet Take 1 tablet (500 mg total) by mouth 2 (two) times daily. 30 tablet 0   . ondansetron (ZOFRAN ODT) 8 MG disintegrating tablet Take 1 tablet (8 mg total) by mouth every 8  (eight) hours as needed for nausea. 10 tablet 0 More than a month at Unknown time    Review of Systems  Gastrointestinal: Positive for abdominal pain (craming).  Genitourinary:       Vaginal discharge  All other systems reviewed and are negative.  Physical Exam   Blood pressure 114/67, pulse 110, temperature 98.7 F (37.1 C), resp. rate 18, height  (1.676 m), weight 78.2 kg (172 lb 6.4 oz), last menstrual period 06/23/2014, SpO2 99 %.  Physical Exam  Constitutional: She is oriented to person, place, and time. She appears well-developed and well-nourished. No distress.  HENT:  Head: Normocephalic.  Eyes: Pupils are equal, round, and reactive to light.  Neck: Normal range of motion. Neck supple.  Cardiovascular: Normal rate, regular rhythm and normal heart sounds.   Respiratory: Effort normal and breath sounds normal.  GI: Soft. She exhibits no mass. There is no tenderness. There is no guarding.  Genitourinary: No bleeding in the vagina. Vaginal discharge (thin, white) found.  Negative cervical motion tenderness  Neurological: She is alert and oriented to person, place, and time. She has normal reflexes.  Skin: Skin is warm and dry.    MAU Course  Procedures  Results for orders placed or performed during the hospital encounter of 07/13/14 (from the past 24  hour(s))  Urinalysis, Routine w reflex microscopic     Status: Abnormal   Collection Time: 07/13/14  3:30 AM  Result Value Ref Range   Color, Urine YELLOW YELLOW   APPearance CLEAR CLEAR   Specific Gravity, Urine <1.005 (L) 1.005 - 1.030   pH 5.5 5.0 - 8.0   Glucose, UA NEGATIVE NEGATIVE mg/dL   Hgb urine dipstick SMALL (A) NEGATIVE   Bilirubin Urine NEGATIVE NEGATIVE   Ketones, ur NEGATIVE NEGATIVE mg/dL   Protein, ur NEGATIVE NEGATIVE mg/dL   Urobilinogen, UA 0.2 0.0 - 1.0 mg/dL   Nitrite NEGATIVE NEGATIVE   Leukocytes, UA NEGATIVE NEGATIVE  Urine microscopic-add on     Status: None   Collection Time: 07/13/14   3:30 AM  Result Value Ref Range   Squamous Epithelial / LPF RARE RARE   WBC, UA 0-2 <3 WBC/hpf   RBC / HPF 0-2 <3 RBC/hpf   Bacteria, UA RARE RARE  Pregnancy, urine POC     Status: None   Collection Time: 07/13/14  3:39 AM  Result Value Ref Range   Preg Test, Ur NEGATIVE NEGATIVE  Wet prep, genital     Status: Abnormal   Collection Time: 07/13/14  3:47 AM  Result Value Ref Range   Yeast Wet Prep HPF POC NONE SEEN NONE SEEN   Trich, Wet Prep NONE SEEN NONE SEEN   Clue Cells Wet Prep HPF POC MODERATE (A) NONE SEEN   WBC, Wet Prep HPF POC MODERATE (A) NONE SEEN     Assessment and Plan  Bacterial Vaginosis  Plan: Discharge to home RX Flagyl 500 mg BID x 7 days HIV and GC/CT pending   Rochele PagesKARIM, Athziri Freundlich N 07/13/2014, 3:34 AM

## 2014-07-13 NOTE — Progress Notes (Signed)
Written and verbal d/c instructions given and understanding voiced. 

## 2014-07-13 NOTE — MAU Note (Signed)
I think i have an infection. Have had period twice this month. Had it earlier this month and started bleeding today.Occ abd cramping. Some white d/c before bleeding started

## 2014-07-13 NOTE — Discharge Instructions (Signed)
Bacterial Vaginosis Bacterial vaginosis is a vaginal infection that occurs when the normal balance of bacteria in the vagina is disrupted. It results from an overgrowth of certain bacteria. This is the most common vaginal infection in women of childbearing age. Treatment is important to prevent complications, especially in pregnant women, as it can cause a premature delivery. CAUSES  Bacterial vaginosis is caused by an increase in harmful bacteria that are normally present in smaller amounts in the vagina. Several different kinds of bacteria can cause bacterial vaginosis. However, the reason that the condition develops is not fully understood. RISK FACTORS Certain activities or behaviors can put you at an increased risk of developing bacterial vaginosis, including:  Having a new sex partner or multiple sex partners.  Douching.  Using an intrauterine device (IUD) for contraception. Women do not get bacterial vaginosis from toilet seats, bedding, swimming pools, or contact with objects around them. SIGNS AND SYMPTOMS  Some women with bacterial vaginosis have no signs or symptoms. Common symptoms include:  Grey vaginal discharge.  A fishlike odor with discharge, especially after sexual intercourse.  Itching or burning of the vagina and vulva.  Burning or pain with urination. DIAGNOSIS  Your health care provider will take a medical history and examine the vagina for signs of bacterial vaginosis. A sample of vaginal fluid may be taken. Your health care provider will look at this sample under a microscope to check for bacteria and abnormal cells. A vaginal pH test may also be done.  TREATMENT  Bacterial vaginosis may be treated with antibiotic medicines. These may be given in the form of a pill or a vaginal cream. A second round of antibiotics may be prescribed if the condition comes back after treatment.  HOME CARE INSTRUCTIONS   Only take over-the-counter or prescription medicines as  directed by your health care provider.  If antibiotic medicine was prescribed, take it as directed. Make sure you finish it even if you start to feel better.  Do not have sex until treatment is completed.  Tell all sexual partners that you have a vaginal infection. They should see their health care provider and be treated if they have problems, such as a mild rash or itching.  Practice safe sex by using condoms and only having one sex partner. SEEK MEDICAL CARE IF:   Your symptoms are not improving after 3 days of treatment.  You have increased discharge or pain.  You have a fever. MAKE SURE YOU:   Understand these instructions.  Will watch your condition.  Will get help right away if you are not doing well or get worse. FOR MORE INFORMATION  Centers for Disease Control and Prevention, Division of STD Prevention: www.cdc.gov/std American Sexual Health Association (ASHA): www.ashastd.org  Document Released: 05/30/2005 Document Revised: 03/20/2013 Document Reviewed: 01/09/2013 ExitCare Patient Information 2015 [employer]. This information is not intended to replace advice given to you by your health care provider. Make sure you discuss any questions you have with your health care provider.  

## 2014-07-13 NOTE — MAU Note (Signed)
Pt called friend to come and pick her up. Pt not driving

## 2014-07-14 ENCOUNTER — Other Ambulatory Visit: Payer: Self-pay | Admitting: Family Medicine

## 2014-07-14 LAB — GC/CHLAMYDIA PROBE AMP (~~LOC~~) NOT AT ARMC
Chlamydia: POSITIVE — AB
Neisseria Gonorrhea: NEGATIVE

## 2014-07-14 MED ORDER — AZITHROMYCIN 250 MG PO TABS: 1000.0000 mg | ORAL_TABLET | Freq: Once | ORAL | Status: DC

## 2014-07-15 LAB — HIV ANTIBODY (ROUTINE TESTING W REFLEX): HIV SCREEN 4TH GENERATION: NONREACTIVE

## 2014-08-16 ENCOUNTER — Encounter (HOSPITAL_COMMUNITY): Payer: Self-pay | Admitting: *Deleted

## 2014-08-16 ENCOUNTER — Inpatient Hospital Stay (HOSPITAL_COMMUNITY)
Admission: AD | Admit: 2014-08-16 | Discharge: 2014-08-16 | Disposition: A | Discharge status: Home / Self Care | Payer: No Typology Code available for payment source | Source: Ambulatory Visit | Attending: Family Medicine | Admitting: Family Medicine

## 2014-08-16 DIAGNOSIS — A5901 Trichomonal vulvovaginitis: Secondary | ICD-10-CM

## 2014-08-16 DIAGNOSIS — A599 Trichomoniasis, unspecified: Secondary | ICD-10-CM | POA: Insufficient documentation

## 2014-08-16 DIAGNOSIS — R1030 Lower abdominal pain, unspecified: Secondary | ICD-10-CM

## 2014-08-16 DIAGNOSIS — N739 Female pelvic inflammatory disease, unspecified: Secondary | ICD-10-CM | POA: Diagnosis not present

## 2014-08-16 DIAGNOSIS — R109 Unspecified abdominal pain: Secondary | ICD-10-CM | POA: Diagnosis present

## 2014-08-16 HISTORY — DX: Headache, unspecified: R51.9

## 2014-08-16 HISTORY — DX: Headache: R51

## 2014-08-16 LAB — CBC
HCT: 40.5 % (ref 36.0–46.0)
Hemoglobin: 13.1 g/dL (ref 12.0–15.0)
MCH: 28.7 pg (ref 26.0–34.0)
MCHC: 32.3 g/dL (ref 30.0–36.0)
MCV: 88.8 fL (ref 78.0–100.0)
Platelets: 332 10*3/uL (ref 150–400)
RBC: 4.56 MIL/uL (ref 3.87–5.11)
RDW: 13.9 % (ref 11.5–15.5)
WBC: 13.3 10*3/uL — AB (ref 4.0–10.5)

## 2014-08-16 LAB — URINALYSIS, ROUTINE W REFLEX MICROSCOPIC
BILIRUBIN URINE: NEGATIVE
Glucose, UA: NEGATIVE mg/dL
KETONES UR: NEGATIVE mg/dL
NITRITE: NEGATIVE
PROTEIN: NEGATIVE mg/dL
Specific Gravity, Urine: 1.02 (ref 1.005–1.030)
Urobilinogen, UA: 0.2 mg/dL (ref 0.0–1.0)
pH: 6.5 (ref 5.0–8.0)

## 2014-08-16 LAB — URINE MICROSCOPIC-ADD ON

## 2014-08-16 LAB — POCT PREGNANCY, URINE: Preg Test, Ur: NEGATIVE

## 2014-08-16 LAB — WET PREP, GENITAL: Yeast Wet Prep HPF POC: NONE SEEN

## 2014-08-16 LAB — HCG, SERUM, QUALITATIVE: Preg, Serum: NEGATIVE

## 2014-08-16 MED ORDER — METRONIDAZOLE 500 MG PO TABS: 1000.0000 mg | ORAL_TABLET | Freq: Once | ORAL | Status: DC

## 2014-08-16 MED ORDER — AZITHROMYCIN 250 MG PO TABS
1000.0000 mg | ORAL_TABLET | Freq: Once | ORAL | Status: AC
  Administered 2014-08-16: 1000 mg via ORAL
  Filled 2014-08-16: qty 4

## 2014-08-16 MED ORDER — DOXYCYCLINE HYCLATE 100 MG PO TABS: 100.0000 mg | ORAL_TABLET | Freq: Two times a day (BID) | ORAL | Status: DC

## 2014-08-16 MED ORDER — DOXYCYCLINE HYCLATE 100 MG PO TABS: 100.0000 mg | ORAL_TABLET | Freq: Once | ORAL | Status: DC

## 2014-08-16 MED ORDER — METRONIDAZOLE 500 MG PO TABS
2000.0000 mg | ORAL_TABLET | Freq: Once | ORAL | Status: AC
  Administered 2014-08-16: 2000 mg via ORAL
  Filled 2014-08-16: qty 4

## 2014-08-16 MED ORDER — CEFTRIAXONE SODIUM 250 MG IJ SOLR
250.0000 mg | Freq: Once | INTRAMUSCULAR | Status: AC
  Administered 2014-08-16: 250 mg via INTRAMUSCULAR
  Filled 2014-08-16: qty 250

## 2014-08-16 MED ORDER — KETOROLAC TROMETHAMINE 60 MG/2ML IM SOLN
60.0000 mg | Freq: Once | INTRAMUSCULAR | Status: AC
  Administered 2014-08-16: 60 mg via INTRAMUSCULAR
  Filled 2014-08-16: qty 2

## 2014-08-16 NOTE — MAU Note (Signed)
Woke up with pain in lower abd.  States is constant, hurt so bad she could hardly walk

## 2014-08-16 NOTE — MAU Provider Note (Signed)
History     CSN: [id number]  Arrival date and time: 08/16/14 1258   None     Chief Complaint  Patient presents with  . Abdominal Pain   HPI Pt is not pregnant LMP 06/30/2014 with neg UPT. and presents with acute onset of lower abdominal pain this morning at 10 am she also has pain with urination- pt has constant pain and hurts to walk; pt has RCM - using condoms for contraception.  Pt also has pain with bowel movement today- pt has had hard stools.Pt denies nausea or vomiting, chills or fever.  RN note: Woke up with pain in lower abd. States is constant, hurt so bad she could hardly walk  Past Medical History  Diagnosis Date  . Gonorrhea   . Chlamydia   . Medical history non-contributory   . Headache   . Infection     UTI; chlamydia    Past Surgical History  Procedure Laterality Date  . Tonsillectomy    . Wisdom tooth extraction      Family History  Problem Relation Age of Onset  . Diabetes Mother   . Diabetes Sister   . Asthma Sister   . Diabetes Maternal Grandmother   . Hypertension Maternal Grandmother   . Heart disease Maternal Grandmother     History  Substance Use Topics  . Smoking status: Former Smoker    Types: Cigarettes  . Smokeless tobacco: Never Used     Comment: quit age20  . Alcohol Use: Yes     Comment: occas.    Allergies: No Known Allergies  Prescriptions prior to admission  Medication Sig Dispense Refill Last Dose  . acetaminophen (TYLENOL) 500 MG tablet Take 1 tablet (500 mg total) by mouth every 6 (six) hours as needed. 30 tablet 0 Past Month at Unknown time  . azithromycin (ZITHROMAX) 250 MG tablet Take 4 tablets (1,000 mg total) by mouth once. 4 tablet 0   . metroNIDAZOLE (FLAGYL) 500 MG tablet Take 1 tablet (500 mg total) by mouth 2 (two) times daily. 14 tablet 0   . naproxen (NAPROSYN) 500 MG tablet Take 1 tablet (500 mg total) by mouth 2 (two) times daily. 30 tablet 0     Review of Systems  Constitutional: Negative for fever  and chills.  Gastrointestinal: Positive for abdominal pain. Negative for nausea, vomiting, diarrhea and constipation.  Genitourinary: Positive for dysuria.   Physical Exam   Blood pressure 123/79, pulse 90, temperature 97.5 F (36.4 C), temperature source Oral, resp. rate 18, height 5' 5.5" (1.664 m), weight 176 lb (79.833 kg), last menstrual period 06/30/2014.  Physical Exam  Nursing note and vitals reviewed. Constitutional: She is oriented to person, place, and time. She appears well-developed and well-nourished. No distress.  HENT:  Head: Normocephalic.  Neck: Normal range of motion. Neck supple.  Cardiovascular: Normal rate.   Respiratory: Effort normal.  GI: Soft. There is tenderness. There is no rebound.  Genitourinary:  Mod amount of creamy cream colored discharge in vault; cervix red, extremely tender to touch and painful motion tenderness; uterus tender- no rebound- diffuse adnexal tenderness with palpation without rebound  Musculoskeletal: Normal range of motion.  Neurological: She is alert and oriented to person, place, and time.  Skin: Skin is warm and dry.  Psychiatric: She has a normal mood and affect.    MAU Course  Procedures Results for orders placed or performed during the hospital encounter of 08/16/14 (from the past 24 hour(s))  Urinalysis, Routine w reflex microscopic  Status: Abnormal   Collection Time: 08/16/14  1:10 PM  Result Value Ref Range   Color, Urine YELLOW YELLOW   APPearance CLEAR CLEAR   Specific Gravity, Urine 1.020 1.005 - 1.030   pH 6.5 5.0 - 8.0   Glucose, UA NEGATIVE NEGATIVE mg/dL   Hgb urine dipstick TRACE (A) NEGATIVE   Bilirubin Urine NEGATIVE NEGATIVE   Ketones, ur NEGATIVE NEGATIVE mg/dL   Protein, ur NEGATIVE NEGATIVE mg/dL   Urobilinogen, UA 0.2 0.0 - 1.0 mg/dL   Nitrite NEGATIVE NEGATIVE   Leukocytes, UA SMALL (A) NEGATIVE  Urine microscopic-add on     Status: Abnormal   Collection Time: 08/16/14  1:10 PM  Result Value  Ref Range   Squamous Epithelial / LPF FEW (A) RARE   WBC, UA 11-20 <3 WBC/hpf   RBC / HPF 3-6 <3 RBC/hpf   Urine-Other MUCOUS PRESENT   Pregnancy, urine POC     Status: None   Collection Time: 08/16/14  1:14 PM  Result Value Ref Range   Preg Test, Ur NEGATIVE NEGATIVE  CBC     Status: Abnormal   Collection Time: 08/16/14  1:45 PM  Result Value Ref Range   WBC 13.3 (H) 4.0 - 10.5 K/uL   RBC 4.56 3.87 - 5.11 MIL/uL   Hemoglobin 13.1 12.0 - 15.0 g/dL   HCT 47.840.5 29.536.0 - 62.146.0 %   MCV 88.8 78.0 - 100.0 fL   MCH 28.7 26.0 - 34.0 pg   MCHC 32.3 30.0 - 36.0 g/dL   RDW 30.813.9 65.711.5 - 84.615.5 %   Platelets 332 150 - 400 K/uL  Wet prep, genital     Status: Abnormal   Collection Time: 08/16/14  1:45 PM  Result Value Ref Range   Yeast Wet Prep HPF POC NONE SEEN NONE SEEN   Trich, Wet Prep RARE (A) NONE SEEN   Clue Cells Wet Prep HPF POC FEW (A) NONE SEEN   WBC, Wet Prep HPF POC FEW (A) NONE SEEN  Rxed for PID and trichomonas Rocephin 250mg  IM  Zithromax 1 gm Flagyl 2 gm Toradol 60mg - pain level improved   Assessment and Plan  PID- treated with Zithromax and Rocephin in MAU and 1st dose of doxycyline Trichomonas- Flagyl 2gm given in MAU Partner to be treated Pt to f/u with GYN crare provider- pt has previously seen [facility] and desires to return  Emily Lowery 08/16/2014, 1:25 PM

## 2014-08-18 LAB — GC/CHLAMYDIA PROBE AMP (~~LOC~~) NOT AT ARMC
CHLAMYDIA, DNA PROBE: NEGATIVE
Neisseria Gonorrhea: NEGATIVE

## 2014-12-05 ENCOUNTER — Encounter (HOSPITAL_COMMUNITY): Payer: Self-pay | Admitting: *Deleted

## 2014-12-05 ENCOUNTER — Emergency Department (HOSPITAL_COMMUNITY)
Admission: EM | Admit: 2014-12-05 | Discharge: 2014-12-05 | Disposition: A | Discharge status: Home / Self Care | Payer: No Typology Code available for payment source | Attending: Emergency Medicine | Admitting: Emergency Medicine

## 2014-12-05 ENCOUNTER — Emergency Department (HOSPITAL_COMMUNITY): Payer: No Typology Code available for payment source

## 2014-12-05 DIAGNOSIS — J3489 Other specified disorders of nose and nasal sinuses: Secondary | ICD-10-CM | POA: Diagnosis not present

## 2014-12-05 DIAGNOSIS — Z87891 Personal history of nicotine dependence: Secondary | ICD-10-CM | POA: Insufficient documentation

## 2014-12-05 DIAGNOSIS — Z7982 Long term (current) use of aspirin: Secondary | ICD-10-CM | POA: Insufficient documentation

## 2014-12-05 DIAGNOSIS — R040 Epistaxis: Secondary | ICD-10-CM | POA: Insufficient documentation

## 2014-12-05 DIAGNOSIS — Z87440 Personal history of urinary (tract) infections: Secondary | ICD-10-CM | POA: Diagnosis not present

## 2014-12-05 DIAGNOSIS — Z3202 Encounter for pregnancy test, result negative: Secondary | ICD-10-CM | POA: Insufficient documentation

## 2014-12-05 DIAGNOSIS — R05 Cough: Secondary | ICD-10-CM | POA: Insufficient documentation

## 2014-12-05 DIAGNOSIS — R042 Hemoptysis: Secondary | ICD-10-CM | POA: Diagnosis present

## 2014-12-05 DIAGNOSIS — Z8619 Personal history of other infectious and parasitic diseases: Secondary | ICD-10-CM | POA: Insufficient documentation

## 2014-12-05 DIAGNOSIS — Z87898 Personal history of other specified conditions: Secondary | ICD-10-CM | POA: Insufficient documentation

## 2014-12-05 DIAGNOSIS — R059 Cough, unspecified: Secondary | ICD-10-CM

## 2014-12-05 LAB — BASIC METABOLIC PANEL
ANION GAP: 10 (ref 5–15)
BUN: 20 mg/dL (ref 6–20)
CALCIUM: 9.6 mg/dL (ref 8.9–10.3)
CO2: 23 mmol/L (ref 22–32)
Chloride: 105 mmol/L (ref 101–111)
Creatinine, Ser: 0.86 mg/dL (ref 0.44–1.00)
GFR calc Af Amer: >60 mL/min (ref >60)
GFR calc non Af Amer: >60 mL/min (ref >60)
Glucose, Bld: 90 mg/dL (ref 65–99)
POTASSIUM: 4 mmol/L (ref 3.5–5.1)
SODIUM: 138 mmol/L (ref 135–145)

## 2014-12-05 LAB — CBC WITH DIFFERENTIAL/PLATELET
Basophils Absolute: 0 10*3/uL (ref 0.0–0.1)
Basophils Relative: 1 % (ref 0–1)
Eosinophils Absolute: 0.1 10*3/uL (ref 0.0–0.7)
Eosinophils Relative: 1 % (ref 0–5)
HEMATOCRIT: 40.7 % (ref 36.0–46.0)
Hemoglobin: 13 g/dL (ref 12.0–15.0)
LYMPHS ABS: 2.9 10*3/uL (ref 0.7–4.0)
Lymphocytes Relative: 44 % (ref 12–46)
MCH: 27.7 pg (ref 26.0–34.0)
MCHC: 31.9 g/dL (ref 30.0–36.0)
MCV: 86.8 fL (ref 78.0–100.0)
MONO ABS: 0.6 10*3/uL (ref 0.1–1.0)
MONOS PCT: 9 % (ref 3–12)
NEUTROS ABS: 3 10*3/uL (ref 1.7–7.7)
Neutrophils Relative %: 45 % (ref 43–77)
Platelets: 360 10*3/uL (ref 150–400)
RBC: 4.69 MIL/uL (ref 3.87–5.11)
RDW: 13.8 % (ref 11.5–15.5)
WBC: 6.6 10*3/uL (ref 4.0–10.5)

## 2014-12-05 LAB — POC URINE PREG, ED: Preg Test, Ur: NEGATIVE

## 2014-12-05 NOTE — Discharge Instructions (Signed)
Cough, Adult  A cough is a reflex that helps clear your throat and airways. It can help heal the body or may be a reaction to an irritated airway. A cough may only last 2 or 3 weeks (acute) or may last more than 8 weeks (chronic).  CAUSES Acute cough:  Viral or bacterial infections. Chronic cough:  Infections.  Allergies.  Asthma.  Post-nasal drip.  Smoking.  Heartburn or acid reflux.  Some medicines.  Chronic lung problems (COPD).  Cancer. SYMPTOMS   Cough.  Fever.  Chest pain.  Increased breathing rate.  High-pitched whistling sound when breathing (wheezing).  Colored mucus that you cough up (sputum). TREATMENT   A bacterial cough may be treated with antibiotic medicine.  A viral cough must run its course and will not respond to antibiotics.  Your caregiver may recommend other treatments if you have a chronic cough. HOME CARE INSTRUCTIONS   Only take over-the-counter or prescription medicines for pain, discomfort, or fever as directed by your caregiver. Use cough suppressants only as directed by your caregiver.  Use a cold steam vaporizer or humidifier in your bedroom or home to help loosen secretions.  Sleep in a semi-upright position if your cough is worse at night.  Rest as needed.  Stop smoking if you smoke. SEEK IMMEDIATE MEDICAL CARE IF:   You have pus in your sputum.  Your cough starts to worsen.  You cannot control your cough with suppressants and are losing sleep.  You begin coughing up blood.  You have difficulty breathing.  You develop pain which is getting worse or is uncontrolled with medicine.  You have a fever. MAKE SURE YOU:   Understand these instructions.  Will watch your condition.  Will get help right away if you are not doing well or get worse. Document Released: 11/26/2010 Document Revised: 08/22/2011 Document Reviewed: 11/26/2010 ExitCare Patient Information 2015 [employer]. This information is not intended  to replace advice given to you by your health care provider. Make sure you discuss any questions you have with your health care provider.  

## 2014-12-05 NOTE — ED Notes (Signed)
Pt reports waking up this morning and coughing up clots of blood, pt reports epistaxis afterwards, which she has hx of. Pt sts usually "my nose bleed first, and I cough up blood sometimes after that" Pt denies pain.

## 2014-12-05 NOTE — ED Provider Notes (Signed)
CSN: [id number]     Arrival date & time 12/05/14  1110 History   First MD Initiated Contact with Patient 12/05/14 1115     Chief Complaint  Patient presents with  . Hemoptysis     (Consider location/radiation/quality/duration/timing/severity/associated sxs/prior Treatment) Patient is a 24 y.o. female presenting with cough.  Cough Cough characteristics:  Productive Sputum characteristics:  Bloody Severity:  Mild Onset quality:  Gradual Duration:  1 hour Timing:  Constant Progression:  Resolved Chronicity:  New Smoker: yes   Context comment:  Concurrent epistaxis Relieved by:  Nothing Worsened by:  Nothing tried Ineffective treatments:  None tried Associated symptoms: no chills, no fever and no shortness of breath     Past Medical History  Diagnosis Date  . Gonorrhea   . Chlamydia   . Medical history non-contributory   . Headache   . Infection     UTI; chlamydia   Past Surgical History  Procedure Laterality Date  . Tonsillectomy    . Wisdom tooth extraction     Family History  Problem Relation Age of Onset  . Diabetes Mother   . Diabetes Sister   . Asthma Sister   . Diabetes Maternal Grandmother   . Hypertension Maternal Grandmother   . Heart disease Maternal Grandmother    History  Substance Use Topics  . Smoking status: Former Smoker    Types: Cigarettes  . Smokeless tobacco: Never Used     Comment: quit age20  . Alcohol Use: Yes     Comment: occas.   OB History    Gravida Para Term Preterm AB TAB SAB Ectopic Multiple Living   0              Review of Systems  Constitutional: Negative for fever and chills.  Respiratory: Positive for cough. Negative for shortness of breath.   All other systems reviewed and are negative.     Allergies  Review of patient's allergies indicates no known allergies.  Home Medications   Prior to Admission medications   Medication Sig Start Date End Date Taking? Authorizing Provider   aspirin-acetaminophen-caffeine (EXCEDRIN MIGRAINE) [phone] MG per tablet Take 1 tablet by mouth every 6 (six) hours as needed for headache.   Yes Historical Provider, MD  doxycycline (VIBRA-TABS) 100 MG tablet Take 1 tablet (100 mg total) by mouth 2 (two) times daily. Patient not taking: Reported on 12/05/2014 08/16/14   Jean Rosenthal, NP   BP 113/66 mmHg  Pulse 65  Temp(Src) 97.6 F (36.4 C) (Oral)  Resp 14  SpO2 100%  LMP 10/24/2014 Physical Exam  Constitutional: She is oriented to person, place, and time. She appears well-developed and well-nourished.  HENT:  Head: Normocephalic and atraumatic.  Right Ear: External ear normal.  Left Ear: External ear normal.  Eyes: Conjunctivae and EOM are normal. Pupils are equal, round, and reactive to light.  Neck: Normal range of motion. Neck supple.  Cardiovascular: Normal rate, regular rhythm, normal heart sounds and intact distal pulses.   Pulmonary/Chest: Effort normal and breath sounds normal.  Abdominal: Soft. Bowel sounds are normal. There is no tenderness.  Musculoskeletal: Normal range of motion.  Neurological: She is alert and oriented to person, place, and time.  Skin: Skin is warm and dry.  Vitals reviewed.   ED Course  Procedures (including critical care time) Labs Review Labs Reviewed  CBC WITH DIFFERENTIAL/PLATELET  BASIC METABOLIC PANEL  POC URINE PREG, ED    Imaging Review Dg Chest 2 View  12/05/2014  CLINICAL DATA:  One day history of hemoptysis  EXAM: CHEST  2 VIEW  COMPARISON:  Oct 12, 2007  FINDINGS: Lungs are clear. Heart size and pulmonary vascularity are normal. No adenopathy. No bone lesions.  IMPRESSION: No edema or consolidation.   Electronically Signed   By: Bretta Bang III M.D.   On: 12/05/2014 12:01     EKG Interpretation None      MDM   Final diagnoses:  Epistaxis, recurrent  Cough    24 y.o. female with pertinent PMH of prior epistaxis presents with hemoptysis on awakening this  am in setting of epistaxis.  Physical exam benign now.  No dyspnea, no recent leg swelling or other signs of PE.  She has had a cough, however this is concurrent with rhinorrhea and signs of URI.  Wu unremarkable.  DC home in stable condition  I have reviewed all laboratory and imaging studies if ordered as above  1. Epistaxis, recurrent   2. Cough         Mirian Mo, MD 12/05/14 1254

## 2014-12-21 ENCOUNTER — Inpatient Hospital Stay (HOSPITAL_COMMUNITY)
Admission: AD | Admit: 2014-12-21 | Discharge: 2014-12-21 | Disposition: A | Discharge status: Home / Self Care | Payer: PRIVATE HEALTH INSURANCE | Source: Ambulatory Visit | Attending: Obstetrics and Gynecology | Admitting: Obstetrics and Gynecology

## 2014-12-21 ENCOUNTER — Encounter (HOSPITAL_COMMUNITY): Payer: Self-pay | Admitting: *Deleted

## 2014-12-21 DIAGNOSIS — N912 Amenorrhea, unspecified: Secondary | ICD-10-CM | POA: Insufficient documentation

## 2014-12-21 DIAGNOSIS — Z87891 Personal history of nicotine dependence: Secondary | ICD-10-CM | POA: Insufficient documentation

## 2014-12-21 DIAGNOSIS — Z7251 High risk heterosexual behavior: Secondary | ICD-10-CM | POA: Insufficient documentation

## 2014-12-21 LAB — RPR: RPR: NONREACTIVE

## 2014-12-21 LAB — URINE MICROSCOPIC-ADD ON

## 2014-12-21 LAB — WET PREP, GENITAL
Clue Cells Wet Prep HPF POC: NONE SEEN
Trich, Wet Prep: NONE SEEN
WBC, Wet Prep HPF POC: NONE SEEN
Yeast Wet Prep HPF POC: NONE SEEN

## 2014-12-21 LAB — URINALYSIS, ROUTINE W REFLEX MICROSCOPIC
Bilirubin Urine: NEGATIVE
Glucose, UA: NEGATIVE mg/dL
Ketones, ur: NEGATIVE mg/dL
LEUKOCYTES UA: NEGATIVE
NITRITE: NEGATIVE
PH: 6.5 (ref 5.0–8.0)
PROTEIN: NEGATIVE mg/dL
Specific Gravity, Urine: 1.025 (ref 1.005–1.030)
Urobilinogen, UA: 0.2 mg/dL (ref 0.0–1.0)

## 2014-12-21 LAB — POCT PREGNANCY, URINE: PREG TEST UR: NEGATIVE

## 2014-12-21 LAB — HIV ANTIBODY (ROUTINE TESTING W REFLEX): HIV SCREEN 4TH GENERATION: NONREACTIVE

## 2014-12-21 NOTE — Discharge Instructions (Signed)
Secondary Amenorrhea  Secondary amenorrhea is the stopping of menstrual flow for 3-6 months in a female who has previously had periods. There are many possible causes. Most of these causes are not serious. Usually, treating the underlying problem causing the loss of menses will return your periods to normal. CAUSES  Some common and uncommon causes of not menstruating include:  Malnutrition.  Low blood sugar (hypoglycemia).  Polycystic ovary disease.  Stress or fear.  Breastfeeding.  Hormone imbalance.  Ovarian failure.  Medicines.  Extreme obesity.  Cystic fibrosis.  Low body weight or drastic weight reduction from any cause.  Early menopause.  Removal of ovaries or uterus.  Contraceptives.  Illness.  Long-term (chronic) illnesses.  Cushing syndrome.  Thyroid problems.  Birth control pills, patches, or vaginal rings for birth control. RISK FACTORS You may be at greater risk of secondary amenorrhea if:  You have a family history of this condition.  You have an eating disorder.  You do athletic training. DIAGNOSIS  A diagnosis is made by your health care provider taking a medical history and doing a physical exam. This will include a pelvic exam to check for problems with your reproductive organs. Pregnancy must be ruled out. Often, numerous blood tests are done to measure different hormones in the body. Urine testing may be done. Specialized exams (ultrasound, CT scan, MRI, or hysteroscopy) may have to be done as well as measuring the body mass index (BMI). TREATMENT  Treatment depends on the cause of the amenorrhea. If an eating disorder is present, this can be treated with an adequate diet and therapy. Chronic illnesses may improve with treatment of the illness. Amenorrhea may be corrected with medicines, lifestyle changes, or surgery. If the amenorrhea cannot be corrected, it is sometimes possible to create a false menstruation with medicines. HOME CARE  INSTRUCTIONS  Maintain a healthy diet.  Manage weight problems.  Exercise regularly but not excessively.  Get adequate sleep.  Manage stress.  Be aware of changes in your menstrual cycle. Keep a record of when your periods occur. Note the date your period starts, how long it lasts, and any problems. SEEK MEDICAL CARE IF: Your symptoms do not get better with treatment. Document Released: 07/11/2006 Document Revised: 01/30/2013 Document Reviewed: 11/15/2012 [facility] Patient Information 2015 [place], [place]. This information is not intended to replace advice given to you by your health care provider. Make sure you discuss any questions you have with your health care provider. Contraception Choices Birth control (contraception) is the use of any methods or devices to stop pregnancy from happening. Below are some methods to help avoid pregnancy. HORMONAL BIRTH CONTROL  A small tube put under the skin of the upper arm (implant). The tube can stay in place for 3 years. The implant must be taken out after 3 years.  Shots given every 3 months.  Pills taken every day.  Patches that are changed once a week.  A ring put into the vagina (vaginal ring). The ring is left in place for 3 weeks and removed for 1 week. Then, a new ring is put in the vagina.  Emergency birth control pills taken after unprotected sex (intercourse). BARRIER BIRTH CONTROL   A thin covering worn on the penis (female condom) during sex.  A soft, loose covering put into the vagina (female condom) before sex.  A rubber bowl that sits over the cervix (diaphragm). The bowl must be made for you. The bowl is put into the vagina before sex. The bowl  is left in place for 6 to 8 hours after sex.  A small, soft cup that fits over the cervix (cervical cap). The cup must be made for you. The cup can be left in place for 48 hours after sex.  A sponge that is put into the vagina before sex.  A chemical that kills or stops sperm  from getting into the cervix and uterus (spermicide). The chemical may be a cream, jelly, foam, or pill. INTRAUTERINE (IUD) BIRTH CONTROL   IUD birth control is a small, T-shaped piece of plastic. The plastic is put inside the uterus. There are 2 types of IUD:  Copper IUD. The IUD is covered in copper wire. The copper makes a fluid that kills sperm. It can stay in place for 10 years.  Hormone IUD. The hormone stops pregnancy from happening. It can stay in place for 5 years. PERMANENT METHODS  When the woman has her fallopian tubes sealed, tied, or blocked during surgery. This stops the egg from traveling to the uterus.  The doctor places a small coil or insert into each fallopian tube. This causes scar tissue to form and blocks the fallopian tubes.  When the female has the tubes that carry sperm tied off (vasectomy). NATURAL FAMILY PLANNING BIRTH CONTROL   Natural family planning means not having sex or using barrier birth control on the days the woman could become pregnant.  Use a calendar to keep track of the length of each period and know the days she can get pregnant.  Avoid sex during ovulation.  Use a thermometer to measure body temperature. Also watch for symptoms of ovulation.  Time sex to be after the woman has ovulated. Use condoms to help protect yourself against sexually transmitted infections (STIs). Do this no matter what type of birth control you use. Talk to your doctor about which type of birth control is best for you. Document Released: 03/27/2009 Document Revised: 06/04/2013 Document Reviewed: 12/19/2012 [facility] Patient Information 2015 [place], [place]. This information is not intended to replace advice given to you by your health care provider. Make sure you discuss any questions you have with your health care provider.

## 2014-12-21 NOTE — MAU Provider Note (Signed)
History     CSN: [id number]  Arrival date and time: 12/21/14 0227   First Provider Initiated Contact with Patient 12/21/14 0310      Chief Complaint  Patient presents with  . Amenorrhea   HPI  Ms. Emily Lowery is a 24 y.o. G0P0 who presents to MAU today with complaint of amenorrhea. The patient states LMP 10/24/14. She denies any recent history of irregular periods. She is sexually active and does not use condoms or birth control. She states that she does not desire pregnancy. She would also like STD check today. She denies vaginal bleeding, abnormal discharge, abdominal pain, UTI symptoms or fever. She states occasional nausea without vomiting, diarrhea or constipation.   OB History    Gravida Para Term Preterm AB TAB SAB Ectopic Multiple Living   0               Past Medical History  Diagnosis Date  . Gonorrhea   . Chlamydia   . Medical history non-contributory   . Headache   . Infection     UTI; chlamydia    Past Surgical History  Procedure Laterality Date  . Tonsillectomy    . Wisdom tooth extraction      Family History  Problem Relation Age of Onset  . Diabetes Mother   . Diabetes Sister   . Asthma Sister   . Diabetes Maternal Grandmother   . Hypertension Maternal Grandmother   . Heart disease Maternal Grandmother     History  Substance Use Topics  . Smoking status: Former Smoker    Types: Cigarettes  . Smokeless tobacco: Never Used     Comment: quit age20  . Alcohol Use: No     Comment: occas.    Allergies: No Known Allergies  Prescriptions prior to admission  Medication Sig Dispense Refill Last Dose  . aspirin-acetaminophen-caffeine (EXCEDRIN MIGRAINE) 250-250-65 MG per tablet Take 1 tablet by mouth every 6 (six) hours as needed for headache.   Past Week at Unknown time  . doxycycline (VIBRA-TABS) 100 MG tablet Take 1 tablet (100 mg total) by mouth 2 (two) times daily. (Patient not taking: Reported on 12/05/2014) 27 tablet 0 Completed Course at  Unknown time    Review of Systems  Constitutional: Negative for fever and malaise/fatigue.  Gastrointestinal: Positive for nausea. Negative for vomiting, abdominal pain, diarrhea and constipation.  Genitourinary: Negative for dysuria, urgency and frequency.       Neg - vaginal bleeding, discharge   Physical Exam   Blood pressure 116/62, pulse 87, temperature 97.7 F (36.5 C), temperature source Oral, resp. rate 16, height 5' 6.5" (1.689 m), weight 182 lb 9.6 oz (82.827 kg), last menstrual period 10/24/2014.  Physical Exam  Nursing note and vitals reviewed. Constitutional: She is oriented to person, place, and time. She appears well-developed and well-nourished. No distress.  HENT:  Head: Normocephalic and atraumatic.  Cardiovascular: Normal rate.   Respiratory: Effort normal.  GI: Soft. She exhibits no distension.  Neurological: She is alert and oriented to person, place, and time.  Skin: Skin is warm and dry. No erythema.  Psychiatric: She has a normal mood and affect.   Results for orders placed or performed during the hospital encounter of 12/21/14 (from the past 24 hour(s))  Urinalysis, Routine w reflex microscopic (not at [facility])     Status: Abnormal   Collection Time: 12/21/14  2:30 AM  Result Value Ref Range   Color, Urine YELLOW YELLOW   APPearance CLEAR CLEAR  Specific Gravity, Urine 1.025 1.005 - 1.030   pH 6.5 5.0 - 8.0   Glucose, UA NEGATIVE NEGATIVE mg/dL   Hgb urine dipstick TRACE (A) NEGATIVE   Bilirubin Urine NEGATIVE NEGATIVE   Ketones, ur NEGATIVE NEGATIVE mg/dL   Protein, ur NEGATIVE NEGATIVE mg/dL   Urobilinogen, UA 0.2 0.0 - 1.0 mg/dL   Nitrite NEGATIVE NEGATIVE   Leukocytes, UA NEGATIVE NEGATIVE  Urine microscopic-add on     Status: Abnormal   Collection Time: 12/21/14  2:30 AM  Result Value Ref Range   Squamous Epithelial / LPF FEW (A) RARE   WBC, UA 3-6 <3 WBC/hpf   RBC / HPF 0-2 <3 RBC/hpf   Bacteria, UA FEW (A) RARE   Urine-Other MUCOUS  PRESENT   Pregnancy, urine POC     Status: None   Collection Time: 12/21/14  2:47 AM  Result Value Ref Range   Preg Test, Ur NEGATIVE NEGATIVE  Wet prep, genital     Status: None   Collection Time: 12/21/14  3:00 AM  Result Value Ref Range   Yeast Wet Prep HPF POC NONE SEEN NONE SEEN   Trich, Wet Prep NONE SEEN NONE SEEN   Clue Cells Wet Prep HPF POC NONE SEEN NONE SEEN   WBC, Wet Prep HPF POC NONE SEEN NONE SEEN    MAU Course  Procedures  MDM UPT - negative UA, wet prep, GC/Chlamydia, HIV and RPR today  Assessment and Plan  A: Amenorrhea High risk sexual behavior  P: Discharge home Safe sex precautions discussed Patient advised to follow-up with [facility] if irregular menses persist or she would like to discuss birth control Patient may return to MAU as needed or if her condition were to change or worsen   Marny LowensteinJulie N Mcguire Gasparyan, PA-C  12/21/2014, 3:20 AM

## 2014-12-21 NOTE — MAU Note (Signed)
Patient states she has not had a period since May 13th and has taken a pregnancy test a few weeks ago that was negative.  No other complaints at this time.

## 2014-12-22 LAB — GC/CHLAMYDIA PROBE AMP (~~LOC~~) NOT AT ARMC
Chlamydia: NEGATIVE
Neisseria Gonorrhea: NEGATIVE

## 2015-07-29 ENCOUNTER — Emergency Department (HOSPITAL_BASED_OUTPATIENT_CLINIC_OR_DEPARTMENT_OTHER)
Admission: EM | Admit: 2015-07-29 | Discharge: 2015-07-29 | Disposition: A | Discharge status: Home / Self Care | Payer: BLUE CROSS/BLUE SHIELD | Attending: Emergency Medicine | Admitting: Emergency Medicine

## 2015-07-29 ENCOUNTER — Encounter (HOSPITAL_BASED_OUTPATIENT_CLINIC_OR_DEPARTMENT_OTHER): Payer: Self-pay | Admitting: *Deleted

## 2015-07-29 ENCOUNTER — Emergency Department (HOSPITAL_COMMUNITY)
Admission: EM | Admit: 2015-07-29 | Discharge: 2015-07-29 | Disposition: A | Discharge status: Home / Self Care | Payer: BLUE CROSS/BLUE SHIELD | Source: Home / Self Care

## 2015-07-29 DIAGNOSIS — J069 Acute upper respiratory infection, unspecified: Secondary | ICD-10-CM | POA: Diagnosis not present

## 2015-07-29 DIAGNOSIS — Z8619 Personal history of other infectious and parasitic diseases: Secondary | ICD-10-CM | POA: Insufficient documentation

## 2015-07-29 DIAGNOSIS — R040 Epistaxis: Secondary | ICD-10-CM | POA: Insufficient documentation

## 2015-07-29 DIAGNOSIS — J029 Acute pharyngitis, unspecified: Secondary | ICD-10-CM | POA: Diagnosis present

## 2015-07-29 DIAGNOSIS — Z87440 Personal history of urinary (tract) infections: Secondary | ICD-10-CM | POA: Diagnosis not present

## 2015-07-29 DIAGNOSIS — Z87891 Personal history of nicotine dependence: Secondary | ICD-10-CM | POA: Insufficient documentation

## 2015-07-29 DIAGNOSIS — B9789 Other viral agents as the cause of diseases classified elsewhere: Secondary | ICD-10-CM

## 2015-07-29 LAB — RAPID STREP SCREEN (MED CTR MEBANE ONLY): STREPTOCOCCUS, GROUP A SCREEN (DIRECT): NEGATIVE

## 2015-07-29 MED ORDER — NAPROXEN 250 MG PO TABS
500.0000 mg | ORAL_TABLET | Freq: Once | ORAL | Status: AC
  Administered 2015-07-29: 500 mg via ORAL
  Filled 2015-07-29: qty 2

## 2015-07-29 MED ORDER — NAPROXEN 500 MG PO TABS: 500.0000 mg | ORAL_TABLET | Freq: Two times a day (BID) | ORAL | Status: DC

## 2015-07-29 MED ORDER — DEXAMETHASONE SODIUM PHOSPHATE 10 MG/ML IJ SOLN
10.0000 mg | Freq: Once | INTRAMUSCULAR | Status: AC
  Administered 2015-07-29: 10 mg via INTRAMUSCULAR
  Filled 2015-07-29: qty 1

## 2015-07-29 MED FILL — NAPROXEN 500 MG TABLET: 500 | 15 days supply | Qty: 30 | Fill #0

## 2015-07-29 NOTE — ED Notes (Signed)
Pt called x1

## 2015-07-29 NOTE — ED Notes (Signed)
Called x2 and reported by registration that patient left

## 2015-07-29 NOTE — ED Notes (Signed)
C/o sorethroat since yesterday. C/o cough with green sputum, and h/s

## 2015-07-29 NOTE — Discharge Instructions (Signed)
Pharyngitis °Pharyngitis is redness, pain, and swelling (inflammation) of your pharynx.  °CAUSES  °Pharyngitis is usually caused by infection. Most of the time, these infections are from viruses (viral) and are part of a cold. However, sometimes pharyngitis is caused by bacteria (bacterial). Pharyngitis can also be caused by allergies. Viral pharyngitis may be spread from person to person by coughing, sneezing, and personal items or utensils (cups, forks, spoons, toothbrushes). Bacterial pharyngitis may be spread from person to person by more intimate contact, such as kissing.  °SIGNS AND SYMPTOMS  °Symptoms of pharyngitis include:   °· Sore throat.   °· Tiredness (fatigue).   °· Low-grade fever.   °· Headache. °· Joint pain and muscle aches. °· Skin rashes. °· Swollen lymph nodes. °· Plaque-like film on throat or tonsils (often seen with bacterial pharyngitis). °DIAGNOSIS  °Your health care provider will ask you questions about your illness and your symptoms. Your medical history, along with a physical exam, is often all that is needed to diagnose pharyngitis. Sometimes, a rapid strep test is done. Other lab tests may also be done, depending on the suspected cause.  °TREATMENT  °Viral pharyngitis will usually get better in 3-4 days without the use of medicine. Bacterial pharyngitis is treated with medicines that kill germs (antibiotics).  °HOME CARE INSTRUCTIONS  °· Drink enough water and fluids to keep your urine clear or pale yellow.   °· Only take over-the-counter or prescription medicines as directed by your health care provider:   °· If you are prescribed antibiotics, make sure you finish them even if you start to feel better.   °· Do not take aspirin.   °· Get lots of rest.   °· Gargle with 8 oz of salt water (½ tsp of salt per 1 qt of water) as often as every 1-2 hours to soothe your throat.   °· Throat lozenges (if you are not at risk for choking) or sprays may be used to soothe your throat. °SEEK MEDICAL  CARE IF:  °· You have large, tender lumps in your neck. °· You have a rash. °· You cough up green, yellow-brown, or bloody spit. °SEEK IMMEDIATE MEDICAL CARE IF:  °· Your neck becomes stiff. °· You drool or are unable to swallow liquids. °· You vomit or are unable to keep medicines or liquids down. °· You have severe pain that does not go away with the use of recommended medicines. °· You have trouble breathing (not caused by a stuffy nose). °MAKE SURE YOU:  °· Understand these instructions. °· Will watch your condition. °· Will get help right away if you are not doing well or get worse. °  °This information is not intended to replace advice given to you by your health care provider. Make sure you discuss any questions you have with your health care provider. °  °Document Released: 05/30/2005 Document Revised: 03/20/2013 Document Reviewed: 02/04/2013 °Elsevier Interactive Patient Education ©2016 Elsevier Inc. ° °Viral Infections °A viral infection can be caused by different types of viruses. Most viral infections are not serious and resolve on their own. However, some infections may cause severe symptoms and may lead to further complications. °SYMPTOMS °Viruses can frequently cause: °· Minor sore throat. °· Aches and pains. °· Headaches. °· Runny nose. °· Different types of rashes. °· Watery eyes. °· Tiredness. °· Cough. °· Loss of appetite. °· Gastrointestinal infections, resulting in nausea, vomiting, and diarrhea. °These symptoms do not respond to antibiotics because the infection is not caused by bacteria. However, you might catch a bacterial infection following the viral   infection. This is sometimes called a "superinfection." Symptoms of such a bacterial infection may include:  Worsening sore throat with pus and difficulty swallowing.  Swollen neck glands.  Chills and a high or persistent fever.  Severe headache.  Tenderness over the sinuses.  Persistent overall ill feeling (malaise), muscle aches,  and tiredness (fatigue).  Persistent cough.  Yellow, green, or brown mucus production with coughing. HOME CARE INSTRUCTIONS   Only take over-the-counter or prescription medicines for pain, discomfort, diarrhea, or fever as directed by your caregiver.  Drink enough water and fluids to keep your urine clear or pale yellow. Sports drinks can provide valuable electrolytes, sugars, and hydration.  Get plenty of rest and maintain proper nutrition. Soups and broths with crackers or rice are fine. SEEK IMMEDIATE MEDICAL CARE IF:   You have severe headaches, shortness of breath, chest pain, neck pain, or an unusual rash.  You have uncontrolled vomiting, diarrhea, or you are unable to keep down fluids.  You or your child has an oral temperature above 102 F (38.9 C), not controlled by medicine.  Your baby is older than 3 months with a rectal temperature of 102 F (38.9 C) or higher.  Your baby is 223 months old or younger with a rectal temperature of 100.4 F (38 C) or higher. MAKE SURE YOU:   Understand these instructions.  Will watch your condition.  Will get help right away if you are not doing well or get worse.   This information is not intended to replace advice given to you by your health care provider. Make sure you discuss any questions you have with your health care provider.   Document Released: 03/09/2005 Document Revised: 08/22/2011 Document Reviewed: 11/05/2014 Elsevier Interactive Patient Education [employer]2016 Elsevier Inc.

## 2015-07-29 NOTE — ED Provider Notes (Addendum)
CSN: [id number]     Arrival date & time 07/29/15  0813 History   First MD Initiated Contact with Patient 07/29/15 [phone]     No chief complaint on file.    (Consider location/radiation/quality/duration/timing/severity/associated sxs/prior Treatment) HPI Comments: Pt comes in with cc of throat pain. Pt reports that she staerted having pain with swallowing yday. Pain is described as the "sore throat" worse with swallowing. No sick contacts. The pain was severe this AM - she was unable to swallow any food so she came to the ER. No n/v/f/c. Pt's LMP was 1/25 - doesn't think she is pregnant.     The history is provided by the patient.    Past Medical History  Diagnosis Date  . Gonorrhea   . Chlamydia   . Medical history non-contributory   . Headache   . Infection     UTI; chlamydia   Past Surgical History  Procedure Laterality Date  . Tonsillectomy    . Wisdom tooth extraction     Family History  Problem Relation Age of Onset  . Diabetes Mother   . Diabetes Sister   . Asthma Sister   . Diabetes Maternal Grandmother   . Hypertension Maternal Grandmother   . Heart disease Maternal Grandmother    Social History  Substance Use Topics  . Smoking status: Former Smoker    Types: Cigarettes  . Smokeless tobacco: Never Used     Comment: quit age20  . Alcohol Use: No     Comment: occas.   OB History    Gravida Para Term Preterm AB TAB SAB Ectopic Multiple Living   0              Review of Systems  Constitutional: Positive for activity change. Negative for fever.  HENT: Positive for congestion, nosebleeds, rhinorrhea, sneezing, sore throat and trouble swallowing. Negative for drooling, mouth sores, postnasal drip and sinus pressure.   Respiratory: Positive for cough. Negative for shortness of breath.   Cardiovascular: Negative for chest pain.  Gastrointestinal: Negative for nausea and vomiting.      Allergies  Review of patient's allergies indicates no known  allergies.  Home Medications   Prior to Admission medications   Medication Sig Start Date End Date Taking? Authorizing Provider  aspirin-acetaminophen-caffeine (EXCEDRIN MIGRAINE) [id number] MG per tablet Take 1 tablet by mouth every 6 (six) hours as needed for headache.   Yes Historical Provider, MD  naproxen (NAPROSYN) 500 MG tablet Take 1 tablet (500 mg total) by mouth 2 (two) times daily. 07/29/15   Danitza Schoenfeldt Rhunette Croft, MD   BP 115/69 mmHg  Pulse 86  Temp(Src) 97.9 F (36.6 C) (Oral)  Resp 18  Ht  (1.676 m)  Wt 185 lb (83.915 kg)  BMI 29.87 kg/m2  SpO2 98%  LMP 07/08/2015 Physical Exam  Constitutional: She is oriented to person, place, and time. She appears well-developed.  HENT:  Head: Normocephalic and atraumatic.  Mouth/Throat: Oropharyngeal exudate present.  Eyes: EOM are normal.  Neck: Normal range of motion. Neck supple.  Cardiovascular: Normal rate.   Pulmonary/Chest: Effort normal.  Abdominal: Bowel sounds are normal.  Lymphadenopathy:    She has cervical adenopathy.  Neurological: She is alert and oriented to person, place, and time.  Skin: Skin is warm and dry.  Nursing note and vitals reviewed.   ED Course  Procedures (including critical care time) Labs Review Labs Reviewed  RAPID STREP SCREEN (NOT AT [facility])  CULTURE, GROUP A STREP [facility])    Imaging  Review No results found. I have personally reviewed and evaluated these images and lab results as part of my medical decision-making.   EKG Interpretation None      MDM   Final diagnoses:  Viral URI with cough  Acute pharyngitis, unspecified pharyngitis type   Pt with URI. Will check for strep - lymphadenopathy, with dysphagia and exudate. Will give decadron IM and naprosyn.    Derwood Kaplan, MD 07/29/15 0981  Derwood Kaplan, MD 07/29/15 [phone]

## 2015-07-31 LAB — CULTURE, GROUP A STREP (THRC)

## 2015-09-02 DIAGNOSIS — R87612 Low grade squamous intraepithelial lesion on cytologic smear of cervix (LGSIL): Secondary | ICD-10-CM | POA: Insufficient documentation

## 2015-09-04 ENCOUNTER — Other Ambulatory Visit: Payer: Self-pay | Admitting: Obstetrics and Gynecology

## 2015-09-04 DIAGNOSIS — E010 Iodine-deficiency related diffuse (endemic) goiter: Secondary | ICD-10-CM

## 2015-09-09 ENCOUNTER — Other Ambulatory Visit: Payer: PRIVATE HEALTH INSURANCE

## 2015-09-10 ENCOUNTER — Other Ambulatory Visit: Payer: PRIVATE HEALTH INSURANCE

## 2015-09-17 ENCOUNTER — Ambulatory Visit
Admission: RE | Admit: 2015-09-17 | Discharge: 2015-09-17 | Disposition: A | Discharge status: Home / Self Care | Payer: BLUE CROSS/BLUE SHIELD | Source: Ambulatory Visit | Attending: Obstetrics and Gynecology | Admitting: Obstetrics and Gynecology

## 2015-09-17 DIAGNOSIS — E010 Iodine-deficiency related diffuse (endemic) goiter: Secondary | ICD-10-CM

## 2015-10-13 DIAGNOSIS — E042 Nontoxic multinodular goiter: Secondary | ICD-10-CM | POA: Insufficient documentation

## 2016-04-05 ENCOUNTER — Emergency Department (HOSPITAL_BASED_OUTPATIENT_CLINIC_OR_DEPARTMENT_OTHER)
Admission: EM | Admit: 2016-04-05 | Discharge: 2016-04-05 | Disposition: A | Discharge status: Home / Self Care | Payer: BLUE CROSS/BLUE SHIELD | Attending: Emergency Medicine | Admitting: Emergency Medicine

## 2016-04-05 ENCOUNTER — Encounter (HOSPITAL_BASED_OUTPATIENT_CLINIC_OR_DEPARTMENT_OTHER): Payer: Self-pay

## 2016-04-05 DIAGNOSIS — M5432 Sciatica, left side: Secondary | ICD-10-CM | POA: Insufficient documentation

## 2016-04-05 DIAGNOSIS — R079 Chest pain, unspecified: Secondary | ICD-10-CM | POA: Diagnosis present

## 2016-04-05 DIAGNOSIS — K219 Gastro-esophageal reflux disease without esophagitis: Secondary | ICD-10-CM | POA: Diagnosis not present

## 2016-04-05 DIAGNOSIS — Z87891 Personal history of nicotine dependence: Secondary | ICD-10-CM | POA: Insufficient documentation

## 2016-04-05 MED ORDER — NAPROXEN 250 MG PO TABS: 500.0000 mg | ORAL_TABLET | Freq: Once | ORAL | Status: DC

## 2016-04-05 MED ORDER — GI COCKTAIL ~~LOC~~: 30.0000 mL | Freq: Once | ORAL | Status: DC

## 2016-04-05 MED ORDER — ALUM & MAG HYDROXIDE-SIMETH 200-200-20 MG/5ML PO SUSP
30.0000 mL | Freq: Once | ORAL | Status: AC
  Administered 2016-04-05: 30 mL via ORAL
  Filled 2016-04-05: qty 30

## 2016-04-05 MED ORDER — ACETAMINOPHEN 500 MG PO TABS
1000.0000 mg | ORAL_TABLET | Freq: Once | ORAL | Status: AC
  Administered 2016-04-05: 1000 mg via ORAL
  Filled 2016-04-05: qty 2

## 2016-04-05 NOTE — ED Triage Notes (Signed)
Pt c/o generalized chest pain that started about 20 minutes ago, sharp and recurrent.  Pt also has pain that radiates down left leg for the last few days.

## 2016-04-05 NOTE — ED Notes (Signed)
Pt verbalizes understanding of d/c instructions and denies any further needs at this time. 

## 2016-04-05 NOTE — ED Provider Notes (Addendum)
[facility] Provider Note   CSN: [id number] Arrival date & time: 04/05/16  0453     History   Chief Complaint Chief Complaint  Patient presents with  . Chest Pain    HPI Emily Lowery is a 25 y.o. female.  The history is provided by the patient.  Chest Pain   This is a new problem. The current episode started less than 1 hour ago. The problem occurs constantly. The problem has not changed since onset.Associated with: post eating. Pain location: entire chest. The pain is moderate. The quality of the pain is described as dull. The pain does not radiate. Exacerbated by: eating. Pertinent negatives include no abdominal pain, no back pain, no cough, no diaphoresis, no dizziness, no exertional chest pressure, no fever, no headaches, no hemoptysis, no irregular heartbeat, no leg pain, no lower extremity edema, no malaise/fatigue, no nausea, no near-syncope, no numbness, no orthopnea, no palpitations, no PND, no shortness of breath, no sputum production, no syncope, no vomiting and no weakness. Associated symptoms comments: Also complains of sciatica. She has tried nothing for the symptoms. The treatment provided no relief. There are no known risk factors.  Pertinent negatives for past medical history include no aneurysm and no Marfan's syndrome.  Pertinent negatives for family medical history include: no aortic dissection.  Procedure history is negative for cardiac catheterization and stress echo.  No OCP no long car trip or plane trip.  Sciatica is ongoing.    Past Medical History:  Diagnosis Date  . Chlamydia   . Gonorrhea   . Headache   . Infection    UTI; chlamydia  . Medical history non-contributory     There are no active problems to display for this patient.   Past Surgical History:  Procedure Laterality Date  . TONSILLECTOMY    . WISDOM TOOTH EXTRACTION      OB History    Gravida Para Term Preterm AB Living   0             SAB TAB Ectopic Multiple Live  Births                   Home Medications    Prior to Admission medications   Medication Sig Start Date End Date Taking? Authorizing Provider  aspirin-acetaminophen-caffeine (EXCEDRIN MIGRAINE) [phone] MG per tablet Take 1 tablet by mouth every 6 (six) hours as needed for headache.    Historical Provider, MD  naproxen (NAPROSYN) 500 MG tablet Take 1 tablet (500 mg total) by mouth 2 (two) times daily. 07/29/15   Derwood Kaplan, MD    Family History Family History  Problem Relation Age of Onset  . Diabetes Mother   . Diabetes Sister   . Asthma Sister   . Diabetes Maternal Grandmother   . Hypertension Maternal Grandmother   . Heart disease Maternal Grandmother     Social History Social History  Substance Use Topics  . Smoking status: Former Smoker    Types: Cigarettes  . Smokeless tobacco: Never Used     Comment: quit age20  . Alcohol use No     Comment: occas.     Allergies   Review of patient's allergies indicates no known allergies.   Review of Systems Review of Systems  Constitutional: Negative for diaphoresis, fever and malaise/fatigue.  Respiratory: Negative for cough, hemoptysis, sputum production and shortness of breath.   Cardiovascular: Positive for chest pain. Negative for palpitations, orthopnea, leg swelling, syncope, PND and near-syncope.  Gastrointestinal: Negative  for abdominal pain, nausea and vomiting.  Musculoskeletal: Negative for back pain.  Neurological: Negative for dizziness, weakness, numbness and headaches.  All other systems reviewed and are negative.    Physical Exam Updated Vital Signs BP 128/76 (BP Location: Right Arm)   Pulse 94   Temp 97.6 F (36.4 C) (Oral)   Resp 18   Ht 5\' 6"  (1.676 m)   Wt 180 lb (81.6 kg)   LMP 02/25/2016   SpO2 99%   BMI 29.05 kg/m   Physical Exam  Constitutional: She is oriented to person, place, and time. She appears well-developed and well-nourished. No distress.  HENT:  Head: Normocephalic  and atraumatic.  Mouth/Throat: No oropharyngeal exudate.  Eyes: EOM are normal. Pupils are equal, round, and reactive to light.  Neck: Normal range of motion. Neck supple.  Cardiovascular: Normal rate and intact distal pulses.   Pulmonary/Chest: Effort normal and breath sounds normal. She has no wheezes. She has no rales.  Abdominal: Soft. She exhibits no mass. Bowel sounds are increased. There is no tenderness. There is no rigidity, no rebound, no guarding, no tenderness at McBurney's point and negative Murphy's sign.  Musculoskeletal: Normal range of motion. She exhibits no edema, tenderness or deformity.  Lymphadenopathy:    She has no cervical adenopathy.  Neurological: She is alert and oriented to person, place, and time. She has normal reflexes.  Skin: Skin is warm and dry. Capillary refill takes less than 2 seconds.  Psychiatric: She has a normal mood and affect.     ED Treatments / Results   Vitals:   04/05/16 0500  BP: 128/76  Pulse: 94  Resp: 18  Temp: 97.6 F (36.4 C)    Date: 04/05/2016  Rate: 87  Rhythm: normal sinus rhythm  QRS Axis: normal  Intervals: normal  ST/T Wave abnormalities: normal  Conduction Disutrbances: none  Narrative Interpretation: unremarkable   Procedures Procedures (including critical care time)  Medications Ordered in ED Medications  naproxen (NAPROSYN) tablet 500 mg (not administered)  gi cocktail (Maalox,Lidocaine,Donnatal) (not administered)    PERC negative wells 0 highly doubt PE.  Symptoms consistent with GERD and her buttock pain is consistent with sciatica Final Clinical Impressions(s) / ED Diagnoses  All questions answered to patient's parents satisfaction. Based on history and exam patient has been appropriately medically screened and emergency conditions excluded. Patient is stable for discharge at this time. Follow up with your PMD for recheck in 2 days and strict return precautions given.  Must return for fevers, spread or  any nose or eye involvement    Markelle Asaro, MD 04/05/16 0535    Frannie Shedrick, MD 04/05/16 [phone]

## 2016-04-22 ENCOUNTER — Emergency Department (HOSPITAL_BASED_OUTPATIENT_CLINIC_OR_DEPARTMENT_OTHER)
Admission: EM | Admit: 2016-04-22 | Discharge: 2016-04-22 | Disposition: A | Discharge status: Home / Self Care | Payer: BLUE CROSS/BLUE SHIELD | Attending: Emergency Medicine | Admitting: Emergency Medicine

## 2016-04-22 ENCOUNTER — Encounter (HOSPITAL_BASED_OUTPATIENT_CLINIC_OR_DEPARTMENT_OTHER): Payer: Self-pay

## 2016-04-22 DIAGNOSIS — Z7982 Long term (current) use of aspirin: Secondary | ICD-10-CM | POA: Diagnosis not present

## 2016-04-22 DIAGNOSIS — Z791 Long term (current) use of non-steroidal anti-inflammatories (NSAID): Secondary | ICD-10-CM | POA: Insufficient documentation

## 2016-04-22 DIAGNOSIS — R109 Unspecified abdominal pain: Secondary | ICD-10-CM | POA: Diagnosis present

## 2016-04-22 DIAGNOSIS — R1032 Left lower quadrant pain: Secondary | ICD-10-CM | POA: Diagnosis not present

## 2016-04-22 DIAGNOSIS — Z87891 Personal history of nicotine dependence: Secondary | ICD-10-CM | POA: Diagnosis not present

## 2016-04-22 LAB — COMPREHENSIVE METABOLIC PANEL
ALK PHOS: 52 U/L (ref 38–126)
ALT: 11 U/L — ABNORMAL LOW (ref 14–54)
AST: 14 U/L — AB (ref 15–41)
Albumin: 4.3 g/dL (ref 3.5–5.0)
Anion gap: 6 (ref 5–15)
BILIRUBIN TOTAL: 0.2 mg/dL — AB (ref 0.3–1.2)
BUN: 21 mg/dL — AB (ref 6–20)
CALCIUM: 9.2 mg/dL (ref 8.9–10.3)
CO2: 26 mmol/L (ref 22–32)
CREATININE: 0.74 mg/dL (ref 0.44–1.00)
Chloride: 105 mmol/L (ref 101–111)
GFR calc Af Amer: >60 mL/min (ref >60)
Glucose, Bld: 97 mg/dL (ref 65–99)
POTASSIUM: 3.9 mmol/L (ref 3.5–5.1)
Sodium: 137 mmol/L (ref 135–145)
TOTAL PROTEIN: 7.6 g/dL (ref 6.5–8.1)

## 2016-04-22 LAB — CBC WITH DIFFERENTIAL/PLATELET
BASOS ABS: 0 10*3/uL (ref 0.0–0.1)
Basophils Relative: 1 %
Eosinophils Absolute: 0.1 10*3/uL (ref 0.0–0.7)
Eosinophils Relative: 1 %
HEMATOCRIT: 36.5 % (ref 36.0–46.0)
HEMOGLOBIN: 11.7 g/dL — AB (ref 12.0–15.0)
LYMPHS PCT: 40 %
Lymphs Abs: 2.5 10*3/uL (ref 0.7–4.0)
MCH: 27.9 pg (ref 26.0–34.0)
MCHC: 32.1 g/dL (ref 30.0–36.0)
MCV: 86.9 fL (ref 78.0–100.0)
MONO ABS: 0.6 10*3/uL (ref 0.1–1.0)
MONOS PCT: 10 %
NEUTROS ABS: 3 10*3/uL (ref 1.7–7.7)
Neutrophils Relative %: 48 %
Platelets: 325 10*3/uL (ref 150–400)
RBC: 4.2 MIL/uL (ref 3.87–5.11)
RDW: 14.5 % (ref 11.5–15.5)
WBC: 6.2 10*3/uL (ref 4.0–10.5)

## 2016-04-22 LAB — LIPASE, BLOOD: LIPASE: 26 U/L (ref 11–51)

## 2016-04-22 LAB — URINALYSIS, ROUTINE W REFLEX MICROSCOPIC
Bilirubin Urine: NEGATIVE
GLUCOSE, UA: NEGATIVE mg/dL
HGB URINE DIPSTICK: NEGATIVE
Ketones, ur: NEGATIVE mg/dL
Leukocytes, UA: NEGATIVE
Nitrite: NEGATIVE
Protein, ur: NEGATIVE mg/dL
SPECIFIC GRAVITY, URINE: 1.039 — AB (ref 1.005–1.030)
pH: 6.5 (ref 5.0–8.0)

## 2016-04-22 LAB — PREGNANCY, URINE: PREG TEST UR: NEGATIVE

## 2016-04-22 MED ORDER — ONDANSETRON HCL 4 MG/2ML IJ SOLN
4.0000 mg | Freq: Once | INTRAMUSCULAR | Status: AC
  Administered 2016-04-22: 4 mg via INTRAVENOUS
  Filled 2016-04-22: qty 2

## 2016-04-22 MED ORDER — MORPHINE SULFATE (PF) 4 MG/ML IV SOLN
4.0000 mg | Freq: Once | INTRAVENOUS | Status: AC
  Administered 2016-04-22: 4 mg via INTRAVENOUS
  Filled 2016-04-22: qty 1

## 2016-04-22 MED ORDER — IBUPROFEN 600 MG PO TABS: 600.0000 mg | ORAL_TABLET | Freq: Four times a day (QID) | ORAL | 0 refills | Status: DC | PRN

## 2016-04-22 NOTE — ED Triage Notes (Signed)
Pt c/o lt side pain since last night at work, no n/v/d; denies urinary s/s; pt c/o sore throat since last night with difficulty swallowing. No distress noted

## 2016-04-22 NOTE — ED Provider Notes (Signed)
[facility] Provider Note   CSN: [id number] Arrival date & time: 04/22/16  [id number]     History   Chief Complaint Chief Complaint  Patient presents with  . Flank Pain   HPI Emily Lowery is a 25 y.o. female.  HPI 25 year old female who presents with left sided abdominal pain and flank. Began yesterday, while sitting down. No prior pain like this before. No medications. No aggravating or modifying factors. A little nausea, but no vomiting. No diarrhea, constipation, fever, hematuria, frequency of urine or dysuria. No abnormal vaginal bleeding or discharge. Pain 8/10 in severity. Pain radiates to LLQ from flank.   Past Medical History:  Diagnosis Date  . Chlamydia   . Gonorrhea   . Headache   . Infection    UTI; chlamydia  . Medical history non-contributory     There are no active problems to display for this patient.   Past Surgical History:  Procedure Laterality Date  . TONSILLECTOMY    . WISDOM TOOTH EXTRACTION      OB History    Gravida Para Term Preterm AB Living   0             SAB TAB Ectopic Multiple Live Births                   Home Medications    Prior to Admission medications   Medication Sig Start Date End Date Taking? Authorizing Provider  aspirin-acetaminophen-caffeine (EXCEDRIN MIGRAINE) [phone] MG per tablet Take 1 tablet by mouth every 6 (six) hours as needed for headache.    Historical Provider, MD  ibuprofen (ADVIL,MOTRIN) 600 MG tablet Take 1 tablet (600 mg total) by mouth every 6 (six) hours as needed. 04/22/16   Lavera Guiseana Duo Liu, MD  naproxen (NAPROSYN) 500 MG tablet Take 1 tablet (500 mg total) by mouth 2 (two) times daily. 07/29/15   Derwood KaplanAnkit Nanavati, MD    Family History Family History  Problem Relation Age of Onset  . Diabetes Mother   . Diabetes Sister   . Asthma Sister   . Diabetes Maternal Grandmother   . Hypertension Maternal Grandmother   . Heart disease Maternal Grandmother     Social History Social History    Substance Use Topics  . Smoking status: Former Smoker    Types: Cigarettes  . Smokeless tobacco: Never Used     Comment: quit age20  . Alcohol use No     Comment: occas.     Allergies   Patient has no known allergies.   Review of Systems Review of Systems 10/14 systems reviewed and are negative other than those stated in the HPI   Physical Exam Updated Vital Signs BP 116/66 (BP Location: Right Arm)   Pulse 81   Temp 97.5 F (36.4 C) (Oral)   Resp 16   Ht 5\' 6"  (1.676 m)   Wt 180 lb (81.6 kg)   LMP 02/25/2016   SpO2 100%   BMI 29.05 kg/m   Physical Exam Physical Exam  Nursing note and vitals reviewed. Constitutional: Well developed, well nourished, non-toxic, and in no acute distress Head: Normocephalic and atraumatic.  Mouth/Throat: Oropharynx is clear and moist.  Neck: Normal range of motion. Neck supple.  Cardiovascular: Normal rate and regular rhythm.   Pulmonary/Chest: Effort normal and breath sounds normal.  Abdominal: Soft. There is LLQ tenderness. There is no rebound and no guarding. Mild left CVA tenderness. No pelvic tenderness. Musculoskeletal: Normal range of motion.  Neurological: Alert,  no facial droop, fluent speech, moves all extremities symmetrically Skin: Skin is warm and dry.  Psychiatric: Cooperative   ED Treatments / Results  Labs (all labs ordered are listed, but only abnormal results are displayed) Labs Reviewed  URINALYSIS, ROUTINE W REFLEX MICROSCOPIC (NOT AT [facility]) - Abnormal; Notable for the following:       Result Value   APPearance CLOUDY (*)    Specific Gravity, Urine 1.039 (*)    All other components within normal limits  CBC WITH DIFFERENTIAL/PLATELET - Abnormal; Notable for the following:    Hemoglobin 11.7 (*)    All other components within normal limits  COMPREHENSIVE METABOLIC PANEL - Abnormal; Notable for the following:    BUN 21 (*)    AST 14 (*)    ALT 11 (*)    Total Bilirubin 0.2 (*)    All other components  within normal limits  PREGNANCY, URINE  LIPASE, BLOOD    EKG  EKG Interpretation None       Radiology No results found.  Procedures Procedures (including critical care time)  Medications Ordered in ED Medications  morphine 4 MG/ML injection 4 mg (4 mg Intravenous Given 04/22/16 0733)  ondansetron (ZOFRAN) injection 4 mg (4 mg Intravenous Given 04/22/16 0732)     Initial Impression / Assessment and Plan / ED Course  I have reviewed the triage vital signs and the nursing notes.  Pertinent labs & imaging results that were available during my care of the patient were reviewed by me and considered in my medical decision making (see chart for details).  Clinical Course     Presenting with left flank pain and LLQ abdominal pain. Afebrile and vital signs reassuring. No signs of systemic illness. Benign abdomen w/ LLQ and left CVA tenderness. Differential including pyelonephritis, urolithiasis, ovarian cyst, diverticulitis. Will obtain UA, pregnancy, and basic blood work.  She has no evidence of urinary tract infection/pyelonephritis there is no blood in her urine 20 suspicion for kidney stones. Basic blood work is reassuring. On reevaluation after analgesics, she says that her pain is fully resolved. However with palpation she still has some minimal left lower quadrant abdominal pain. Discuss potential imaging for this to rule out nephrolithiasis versus diverticulitis versus pelvic pathology. However at this time she would like to defer any additional imaging like to continue some supportive care for the next few days. I do not think this is unreasonable given her stable vital signs, reassuring blood work, and overall benign abdomen. Did discuss strict return instructions. She expressed understanding of all discharge instructions, and felt comfortable with the plan of care.    Final Clinical Impressions(s) / ED Diagnoses   Final diagnoses:  Left lower quadrant pain    New  Prescriptions New Prescriptions   IBUPROFEN (ADVIL,MOTRIN) 600 MG TABLET    Take 1 tablet (600 mg total) by mouth every 6 (six) hours as needed.     Lavera Guiseana Duo Liu, MD 04/22/16 [phone]

## 2016-04-22 NOTE — Discharge Instructions (Signed)
Your blood work and urine are very reassuring today. Take ibuprofen and tylenol for pain.  We discussed potential CT today, but at this time you would like to defer. Return without fail for worsening symptoms, including fever, worsening pain, intractable vomiting or any other symptoms concerning to you.

## 2016-07-16 ENCOUNTER — Encounter (HOSPITAL_COMMUNITY): Payer: Self-pay

## 2016-07-16 ENCOUNTER — Inpatient Hospital Stay (HOSPITAL_COMMUNITY)
Admission: AD | Admit: 2016-07-16 | Discharge: 2016-07-16 | Disposition: A | Discharge status: Home / Self Care | Payer: BLUE CROSS/BLUE SHIELD | Source: Ambulatory Visit | Attending: Obstetrics & Gynecology | Admitting: Obstetrics & Gynecology

## 2016-07-16 DIAGNOSIS — N911 Secondary amenorrhea: Secondary | ICD-10-CM

## 2016-07-16 DIAGNOSIS — N644 Mastodynia: Secondary | ICD-10-CM | POA: Diagnosis not present

## 2016-07-16 MED ORDER — CONCEPT OB 130-92.4-1 MG PO CAPS: 1.0000 | ORAL_CAPSULE | Freq: Every day | ORAL | 12 refills | Status: DC

## 2016-07-16 NOTE — [facility] Provider Note (Signed)
Emily Lowery is a 26 y.o. female who presents to [facility]U today reporting being 3 weeks late for her period, pos home UPT and having breast tenderness. Requesting pregnancy verification letter. The patient denies abdominal pain or vaginal bleeding today.  BP 128/63 (BP Location: Right Arm)   Temp 98.7 F (37.1 C) (Oral)   Resp 16   Ht 5\' 6"  (1.676 m)   Wt 190 lb (86.2 kg)   LMP 05/28/2016   BMI 30.67 kg/m   CONSTITUTIONAL: Well-developed, well-nourished female in no acute distress.  ENT: External right and left ear normal.  EYES: EOM intact, conjunctivae normal.  MUSCULOSKELETAL: Normal range of motion.  CARDIOVASCULAR: Regular heart rate RESPIRATORY: Normal effort NEUROLOGICAL: Alert and oriented to person, place, and time.  SKIN: Not diaphoretic. No erythema. No pallor. PSYCH: Normal mood and affect. Normal behavior. Normal judgment and thought content.  No results found for this or any previous visit (from the past 24 hour(s)).  A: 1. Secondary amenorrhea   2. Mastalgia in female   3.      Pos home UPT  P: Informed that [facility] does not do pregnancy verifications. [facility] during business hours for letter.  Discharge home List of area OB providers given Rx prenatal vitamins First trimester warning signs reviewed Patient may return to [facility]U as needed for emergencies.   John DayVirginia Josejuan Hoaglin, CNM  07/16/2016 11:07 AM

## 2016-07-16 NOTE — MAU Note (Signed)
Patient presents with breast soreness x 2 weeks, missed period, positive test at home, LMP 05/28/2016

## 2016-07-16 NOTE — Discharge Instructions (Signed)
Pregnancy Test Information WHAT IS A PREGNANCY TEST? A pregnancy test is used to detect the presence of human chorionic gonadotropin (hCG) in a sample of your urine or blood. hCG is a hormone produced by the cells of the placenta. The placenta is the organ that forms to nourish and support a developing baby. This test requires a sample of either blood or urine. A pregnancy test determines whether you are pregnant or not. HOW ARE PREGNANCY TESTS DONE? Pregnancy tests are done using a home pregnancy test or having a blood or urine test done at your health care provider's office. Home pregnancy tests require a urine sample.  Most kits use a plastic testing device with a strip of paper that indicates whether there is hCG in your urine.  Follow the test instructions very carefully.  After you urinate on the test stick, markings will appear to let you know whether you are pregnant.  For best results, use your first urine of the morning. That is when the concentration of hCG is highest. Having a blood test to check for pregnancy requires a sample of blood drawn from a vein in your hand or arm. Your health care provider will send your sample to a lab for testing. Results of a pregnancy test will be positive or negative. IS ONE TYPE OF PREGNANCY TEST BETTER THAN ANOTHER? In some cases, a blood test will return a positive result even if a urine test was negative because blood tests are more sensitive. This means blood tests can detect hCG earlier than home pregnancy tests. HOW ACCURATE ARE HOME PREGNANCY TESTS? Both types of pregnancy tests are very accurate.  A blood test is about 98% accurate.  When you are far enough along in your pregnancy and when used correctly, home pregnancy tests are equally accurate. CAN ANYTHING INTERFERE WITH HOME PREGNANCY TEST RESULTS? It is possible for certain conditions to cause an inaccurate test result (false positive or falsenegative).  A false positive is a  positive test result when you are not pregnant. This can happen if you:  Are taking certain medicines, including anticonvulsants or tranquilizers.  Have certain proteins in your blood.  A false negative is a negative test result when you are pregnant. This can happen if you:  Took the test before there was enough hCG to detect. A pregnancy test will not be positive in most women until 3-4 weeks after conception.  Drank a lot of liquid before the test. Diluted urine samples can sometimes give an inaccurate result.  Take certain medicines, such as water pills (diuretics) or some antihistamines. WHAT SHOULD I DO IF I HAVE A POSITIVE PREGNANCY TEST? If you have a positive pregnancy test, schedule an appointment with your health care provider. You might need additional testing to confirm the pregnancy. In the meantime, begin taking a prenatal vitamin, stop smoking, stop drinking alcohol, and do not use street drugs. Talk to your health care provider about how to take care of yourself during your pregnancy. Ask about what to expect from the care you will need throughout pregnancy (prenatal care). This information is not intended to replace advice given to you by your health care provider. Make sure you discuss any questions you have with your health care provider. Document Released: 06/02/2003 Document Revised: 02/03/2016 Document Reviewed: 09/24/2013 Elsevier Interactive Patient Education  2017 [employer]   Prenatal Care WHAT IS PRENATAL CARE? Prenatal care is the process of caring for a pregnant woman before she gives birth. Prenatal care  makes sure that she and her baby remain as healthy as possible throughout pregnancy. Prenatal care may be provided by a midwife, family practice health care provider, or a childbirth and pregnancy specialist (obstetrician). Prenatal care may include physical examinations, testing, treatments, and education on nutrition, lifestyle, and social support  services. WHY IS PRENATAL CARE SO IMPORTANT? Early and consistent prenatal care increases the chance that you and your baby will remain healthy throughout your pregnancy. This type of care also decreases a babys risk of being born too early (prematurely), or being born smaller than expected (small for gestational age). Any underlying medical conditions you may have that could pose a risk during your pregnancy are discussed during prenatal care visits. You will also be monitored regularly for any new conditions that may arise during your pregnancy so they can be treated quickly and effectively. WHAT HAPPENS DURING PRENATAL CARE VISITS? Prenatal care visits may include the following: Discussion Tell your health care provider about any new signs or symptoms you have experienced since your last visit. These might include:  Nausea or vomiting.  Increased or decreased level of energy.  Difficulty sleeping.  Back or leg pain.  Weight changes.  Frequent urination.  Shortness of breath with physical activity.  Changes in your skin, such as the development of a rash or itchiness.  Vaginal discharge or bleeding.  Feelings of excitement or nervousness.  Changes in your babys movements. You may want to write down any questions or topics you want to discuss with your health care provider and bring them with you to your appointment. Examination During your first prenatal care visit, you will likely have a complete physical exam. Your health care provider will often examine your vagina, cervix, and the position of your uterus, as well as check your heart, lungs, and other body systems. As your pregnancy progresses, your health care provider will measure the size of your uterus and your babys position inside your uterus. He or she may also examine you for early signs of labor. Your prenatal visits may also include checking your blood pressure and, after about 10-12 weeks of pregnancy, listening to  your babys heartbeat. Testing Regular testing often includes:  Urinalysis. This checks your urine for glucose, protein, or signs of infection.  Blood count. This checks the levels of white and red blood cells in your body.  Tests for sexually transmitted infections (STIs). Testing for STIs at the beginning of pregnancy is routinely done and is required in many states.  Antibody testing. You will be checked to see if you are immune to certain illnesses, such as rubella, that can affect a developing fetus.  Glucose screen. Around 24-28 weeks of pregnancy, your blood glucose level will be checked for signs of gestational diabetes. Follow-up tests may be recommended.  Group B strep. This is a bacteria that is commonly found inside a womans vagina. This test will inform your health care provider if you need an antibiotic to reduce the amount of this bacteria in your body prior to labor and childbirth.  Ultrasound. Many pregnant women undergo an ultrasound screening around 18-20 weeks of pregnancy to evaluate the health of the fetus and check for any developmental abnormalities.  HIV (human immunodeficiency virus) testing. Early in your pregnancy, you will be screened for HIV. If you are at high risk for HIV, this test may be repeated during your third trimester of pregnancy. You may be offered other testing based on your age, personal or family medical history,  or other factors. HOW OFTEN SHOULD I PLAN TO SEE MY HEALTH CARE PROVIDER FOR PRENATAL CARE? Your prenatal care check-up schedule depends on any medical conditions you have before, or develop during, your pregnancy. If you do not have any underlying medical conditions, you will likely be seen for checkups:  Monthly, during the first 6 months of pregnancy.  Twice a month during months 7 and 8 of pregnancy.  Weekly starting in the 9th month of pregnancy and until delivery. If you develop signs of early labor or other concerning signs or  symptoms, you may need to see your health care provider more often. Ask your health care provider what prenatal care schedule is best for you. WHAT CAN I DO TO KEEP MYSELF AND MY BABY AS HEALTHY AS POSSIBLE DURING MY PREGNANCY?  Take a prenatal vitamin containing 400 micrograms (0.4 mg) of folic acid every day. Your health care provider may also ask you to take additional vitamins such as iodine, vitamin D, iron, copper, and zinc.  Take 1500-2000 mg of calcium daily starting at your 20th week of pregnancy until you deliver your baby.  Make sure you are up to date on your vaccinations. Unless directed otherwise by your health care provider:  You should receive a tetanus, diphtheria, and pertussis (Tdap) vaccination between the 27th and 36th week of your pregnancy, regardless of when your last Tdap immunization occurred. This helps protect your baby from whooping cough (pertussis) after he or she is born.  You should receive an annual inactivated influenza vaccine (IIV) to help protect you and your baby from influenza. This can be done at any point during your pregnancy.  Eat a well-rounded diet that includes:  Fresh fruits and vegetables.  Lean proteins.  Calcium-rich foods such as milk, yogurt, hard cheeses, and dark, leafy greens.  Whole grain breads.  Do noteat seafood high in mercury, including:  Swordfish.  Tilefish.  Shark.  King mackerel.  More than 6 oz tuna per week.  Do not eat:  Raw or undercooked meats or eggs.  Unpasteurized foods, such as soft cheeses (brie, blue, or feta), juices, and milks.  Lunch meats.  Hot dogs that have not been heated until they are steaming.  Drink enough water to keep your urine clear or pale yellow. For many women, this may be 10 or more 8 oz glasses of water each day. Keeping yourself hydrated helps deliver nutrients to your baby and may prevent the start of pre-term uterine contractions.  Do not use any tobacco products  including cigarettes, chewing tobacco, or electronic cigarettes. If you need help quitting, ask your health care provider.  Do not drink beverages containing alcohol. No safe level of alcohol consumption during pregnancy has been determined.  Do not use any illegal drugs. These can harm your developing baby or cause a miscarriage.  Ask your health care provider or pharmacist before taking any prescription or over-the-counter medicines, herbs, or supplements.  Limit your caffeine intake to no more than 200 mg per day.  Exercise. Unless told otherwise by your health care provider, try to get 30 minutes of moderate exercise most days of the week. Do not  do high-impact activities, contact sports, or activities with a high risk of falling, such as horseback riding or downhill skiing.  Get plenty of rest.  Avoid anything that raises your body temperature, such as hot tubs and saunas.  If you own a cat, do not empty its litter box. Bacteria contained in cat feces  can cause an infection called toxoplasmosis. This can result in serious harm to the fetus.  Stay away from chemicals such as insecticides, lead, mercury, and cleaning or paint products that contain solvents.  Do not have any X-rays taken unless medically necessary.  Take a childbirth and breastfeeding preparation class. Ask your health care provider if you need a referral or recommendation. This information is not intended to replace advice given to you by your health care provider. Make sure you discuss any questions you have with your health care provider. Document Released: 06/02/2003 Document Revised: 11/02/2015 Document Reviewed: 08/14/2013 Elsevier Interactive Patient Education  2017 [employer].

## 2016-07-19 ENCOUNTER — Ambulatory Visit (INDEPENDENT_AMBULATORY_CARE_PROVIDER_SITE_OTHER): Payer: BLUE CROSS/BLUE SHIELD | Admitting: *Deleted

## 2016-07-19 ENCOUNTER — Encounter: Payer: Self-pay | Admitting: Family Medicine

## 2016-07-19 DIAGNOSIS — Z3200 Encounter for pregnancy test, result unknown: Secondary | ICD-10-CM

## 2016-07-19 DIAGNOSIS — Z3201 Encounter for pregnancy test, result positive: Secondary | ICD-10-CM

## 2016-07-19 LAB — POCT PREGNANCY, URINE: Preg Test, Ur: POSITIVE — AB

## 2016-07-19 NOTE — Progress Notes (Signed)
+  UPT today.  LMP - 05/28/16  EDD 03/04/17.

## 2016-07-30 ENCOUNTER — Inpatient Hospital Stay (HOSPITAL_COMMUNITY)
Admission: AD | Admit: 2016-07-30 | Discharge: 2016-07-30 | Disposition: A | Discharge status: Home / Self Care | Payer: BLUE CROSS/BLUE SHIELD | Source: Ambulatory Visit | Attending: Family Medicine | Admitting: Family Medicine

## 2016-07-30 ENCOUNTER — Inpatient Hospital Stay (HOSPITAL_COMMUNITY): Payer: BLUE CROSS/BLUE SHIELD

## 2016-07-30 ENCOUNTER — Encounter (HOSPITAL_COMMUNITY): Payer: Self-pay | Admitting: *Deleted

## 2016-07-30 DIAGNOSIS — O209 Hemorrhage in early pregnancy, unspecified: Secondary | ICD-10-CM

## 2016-07-30 DIAGNOSIS — Z3A09 9 weeks gestation of pregnancy: Secondary | ICD-10-CM | POA: Insufficient documentation

## 2016-07-30 DIAGNOSIS — Z3491 Encounter for supervision of normal pregnancy, unspecified, first trimester: Secondary | ICD-10-CM

## 2016-07-30 DIAGNOSIS — O4690 Antepartum hemorrhage, unspecified, unspecified trimester: Secondary | ICD-10-CM

## 2016-07-30 LAB — URINALYSIS, ROUTINE W REFLEX MICROSCOPIC
Bilirubin Urine: NEGATIVE
GLUCOSE, UA: NEGATIVE mg/dL
Hgb urine dipstick: NEGATIVE
Ketones, ur: NEGATIVE mg/dL
LEUKOCYTES UA: NEGATIVE
Nitrite: NEGATIVE
PH: 6 (ref 5.0–8.0)
PROTEIN: NEGATIVE mg/dL
Specific Gravity, Urine: 1.023 (ref 1.005–1.030)

## 2016-07-30 LAB — CBC
HEMATOCRIT: 37.8 % (ref 36.0–46.0)
HEMOGLOBIN: 12.5 g/dL (ref 12.0–15.0)
MCH: 27.7 pg (ref 26.0–34.0)
MCHC: 33.1 g/dL (ref 30.0–36.0)
MCV: 83.6 fL (ref 78.0–100.0)
Platelets: 391 10*3/uL (ref 150–400)
RBC: 4.52 MIL/uL (ref 3.87–5.11)
RDW: 14.5 % (ref 11.5–15.5)
WBC: 6.7 10*3/uL (ref 4.0–10.5)

## 2016-07-30 LAB — WET PREP, GENITAL
Clue Cells Wet Prep HPF POC: NONE SEEN
Sperm: NONE SEEN
Trich, Wet Prep: NONE SEEN
Yeast Wet Prep HPF POC: NONE SEEN

## 2016-07-30 LAB — ABO/RH: ABO/RH(D): O POS

## 2016-07-30 LAB — HCG, QUANTITATIVE, PREGNANCY: hCG, Beta Chain, Quant, S: 43176 m[IU]/mL — ABNORMAL HIGH (ref <5)

## 2016-07-30 NOTE — [facility]U Note (Signed)
Went to [facility] this am and when I wiped it was red. Some cramping that I have been having for couple wks. Did have intercourse last pm.

## 2016-07-30 NOTE — MAU Provider Note (Signed)
Chief Complaint: Vaginal Bleeding   SUBJECTIVE HPI: Emily Lowery is a 26 y.o. G1P0 at [redacted]w[redacted]d who presents to [facility] reporting vaginal bleeding.  Patient woke up this morning to use the bathroom, when she went to urinate, she noted bright red blood on the toilet paper when wiping. There was not blood in the toilet. She denies any abdominal or pelvic pain. No recent intercourse. Denies abnormal vaginal discharge. No urinary symptoms. No bloody stools. Patient states she is unsure she wants to keep this pregnancy. Patient does have a history of chlamydia in 2016, and gonorrhea in 2013.    Past Medical History:  Diagnosis Date  . Chlamydia   . Gonorrhea   . Headache   . Infection    UTI; chlamydia  . Medical history non-contributory    OB History  Gravida Para Term Preterm AB Living  1            SAB TAB Ectopic Multiple Live Births               # Outcome Date GA Lbr Len/2nd Weight Sex Delivery Anes PTL Lv  1 Current              Past Surgical History:  Procedure Laterality Date  . TONSILLECTOMY    . WISDOM TOOTH EXTRACTION     Social History   Social History  . Marital status: Single    Spouse name: N/A  . Number of children: N/A  . Years of education: N/A   Occupational History  . Not on file.   Social History Main Topics  . Smoking status: Former Smoker    Types: Cigarettes  . Smokeless tobacco: Never Used     Comment: quit age20  . Alcohol use No     Comment: occas.  . Drug use: Yes    Frequency: 7.0 times per week    Types: Marijuana     Comment: last smoked last night  . Sexual activity: Yes    Birth control/ protection: None   Other Topics Concern  . Not on file   Social History Narrative  . No narrative on file   No current facility-administered medications on file prior to encounter.    Current Outpatient Prescriptions on File Prior to Encounter  Medication Sig Dispense Refill  . Prenat w/o A Vit-FeFum-FePo-FA (CONCEPT OB)  130-92.4-1 MG CAPS Take 1 tablet by mouth daily. (Patient not taking: Reported on 07/19/2016) 30 capsule 12   No Known Allergies  I have reviewed the past Medical Hx, Surgical Hx, Social Hx, Allergies and Medications.   REVIEW OF SYSTEMS  A comprehensive ROS was negative except per HPI.   OBJECTIVE Patient Vitals for the past 24 hrs:  BP Temp Pulse Resp Height Weight  07/30/16 0558 112/68 98.8 F (37.1 C) 83 18 5\' 6"  (1.676 m) 191 lb (86.6 kg)    PHYSICAL EXAM Constitutional: Well-developed, well-nourished obese female in no acute distress.  Cardiovascular: normal rate, rhythm, no murmurs Respiratory: normal rate and effort. CTAB GI: Abd soft, non-tender, non-distended. Pos BS x 4 MS: Extremities nontender, no edema, normal ROM Neurologic: Alert and oriented x 4.  GU: Neg CVAT. SPECULUM EXAM: NEFG, physiologic discharge, no blood noted, cervix clean BIMANUAL: cervix closed, thick, posterior, high; uterus normal size for gravid age, no adnexal tenderness or masses. No CMT.  LAB RESULTS Results for orders placed or performed during the hospital encounter of 07/30/16 (from the past 24 hour(s))  CBC  Status: None   Collection Time: 07/30/16  6:03 AM  Result Value Ref Range   WBC 6.7 4.0 - 10.5 K/uL   RBC 4.52 3.87 - 5.11 MIL/uL   Hemoglobin 12.5 12.0 - 15.0 g/dL   HCT 16.1 09.6 - 04.5 %   MCV 83.6 78.0 - 100.0 fL   MCH 27.7 26.0 - 34.0 pg   MCHC 33.1 30.0 - 36.0 g/dL   RDW 40.9 81.1 - 91.4 %   Platelets 391 150 - 400 K/uL  hCG, quantitative, pregnancy     Status: Abnormal   Collection Time: 07/30/16  6:03 AM  Result Value Ref Range   hCG, Beta Chain, Quant, S 43,176 (H) <5 mIU/mL  ABO/Rh     Status: None (Preliminary result)   Collection Time: 07/30/16  6:03 AM  Result Value Ref Range   ABO/RH(D) O POS   Urinalysis, Routine w reflex microscopic     Status: None   Collection Time: 07/30/16  6:10 AM  Result Value Ref Range   Color, Urine YELLOW YELLOW   APPearance  CLEAR CLEAR   Specific Gravity, Urine 1.023 1.005 - 1.030   pH 6.0 5.0 - 8.0   Glucose, UA NEGATIVE NEGATIVE mg/dL   Hgb urine dipstick NEGATIVE NEGATIVE   Bilirubin Urine NEGATIVE NEGATIVE   Ketones, ur NEGATIVE NEGATIVE mg/dL   Protein, ur NEGATIVE NEGATIVE mg/dL   Nitrite NEGATIVE NEGATIVE   Leukocytes, UA NEGATIVE NEGATIVE  Wet prep, genital     Status: Abnormal   Collection Time: 07/30/16  6:55 AM  Result Value Ref Range   Yeast Wet Prep HPF POC NONE SEEN NONE SEEN   Trich, Wet Prep NONE SEEN NONE SEEN   Clue Cells Wet Prep HPF POC NONE SEEN NONE SEEN   WBC, Wet Prep HPF POC MANY (A) NONE SEEN   Sperm NONE SEEN     IMAGING US Ob Comp Less 14 Wks  Result Date: 07/30/2016 CLINICAL DATA:  Initial evaluation for vaginal bleeding/ spotting, currently pregnant. EXAM: OBSTETRIC <14 WK [place] AND TRANSVAGINAL OB US TECHNIQUE: Both transabdominal and transvaginal ultrasound examinations were performed for complete evaluation of the gestation as well as the maternal uterus, adnexal regions, and pelvic cul-de-sac. Transvaginal technique was performed to assess early pregnancy. COMPARISON:  None available. FINDINGS: Intrauterine gestational sac: Single Yolk sac:  Present Embryo:  Present Cardiac Activity: Present Heart Rate: 166  bpm MSD:   mm    w     d CRL:  24.1  mm   9 w   1 d                  [place] EDC: 03/03/2017 Subchorionic hemorrhage:  None visualized. Maternal uterus/adnexae: Normal sonographic appearance of the ovaries and adnexa bilaterally. IMPRESSION: Single viable intrauterine pregnancy as above without complication. No acute abnormality identified. Electronically Signed   By: Rise Mu M.D.   On: 07/30/2016 06:41   US Ob Transvaginal  Result Date: 07/30/2016 CLINICAL DATA:  Initial evaluation for vaginal bleeding/ spotting, currently pregnant. EXAM: OBSTETRIC <14 WK [place] AND TRANSVAGINAL OB US TECHNIQUE: Both transabdominal and transvaginal ultrasound examinations were performed  for complete evaluation of the gestation as well as the maternal uterus, adnexal regions, and pelvic cul-de-sac. Transvaginal technique was performed to assess early pregnancy. COMPARISON:  None available. FINDINGS: Intrauterine gestational sac: Single Yolk sac:  Present Embryo:  Present Cardiac Activity: Present Heart Rate: 166  bpm MSD:   mm    w  d CRL:  24.1  mm   9 w   1 d                  US EDC: 03/03/2017 Subchorionic hemorrhage:  None visualized. Maternal uterus/adnexae: Normal sonographic appearance of the ovaries and adnexa bilaterally. IMPRESSION: Single viable intrauterine pregnancy as above without complication. No acute abnormality identified. Electronically Signed   By: Rise MuBenjamin  McClintock M.D.   On: 07/30/2016 06:41    MAU COURSE CBC - no anemia BHCG - 43,176 TVUS - IUP seen, 3142w0d SSE - no bleeding SVE - closed Wet Prep - NEG GC/CT - pending  MDM Plan of care reviewed with patient, including labs and tests ordered and medical treatment. Discussed with patient no bleeding noted on exam, normal discharge seen. UA was normal. Bleeding precautions given to patient.    ASSESSMENT 1. Vaginal bleeding in pregnancy, first trimester   2. Vaginal bleeding in pregnancy   3. Normal IUP (intrauterine pregnancy) on prenatal ultrasound, first trimester     PLAN Discharge home in stable condition. Follow up with OB provider of choice in 1-2 weeks for initial OB appointment Bleeding precautions given   Follow-up Information    OB/GYN of your choice. Schedule an appointment as soon as possible for a visit in 1 week(s).   Why:  For initial OB visit         Allergies as of 07/30/2016   No Known Allergies     Medication List    TAKE these medications   CONCEPT OB 130-92.4-1 MG Caps Take 1 tablet by mouth daily.        Jen MowElizabeth Editha Bridgeforth, DO OB Fellow 07/30/2016 7:25 AM

## 2016-07-30 NOTE — Discharge Instructions (Signed)
Vaginal Bleeding During Pregnancy, First Trimester °A small amount of bleeding (spotting) from the vagina is common in early pregnancy. Sometimes the bleeding is normal and is not a problem, and sometimes it is a sign of something serious. Be sure to tell your doctor about any bleeding from your vagina right away. °Follow these instructions at home: °· Watch your condition for any changes. °· Follow your doctor's instructions about how active you can be. °· If you are on bed rest: °¨ You may need to stay in bed and only get up to use the bathroom. °¨ You may be allowed to do some activities. °¨ If you need help, make plans for someone to help you. °· Write down: °¨ The number of pads you use each day. °¨ How often you change pads. °¨ How soaked (saturated) your pads are. °· Do not use tampons. °· Do not douche. °· Do not have sex or orgasms until your doctor says it is okay. °· If you pass any tissue from your vagina, save the tissue so you can show it to your doctor. °· Only take medicines as told by your doctor. °· Do not take aspirin because it can make you bleed. °· Keep all follow-up visits as told by your doctor. °Contact a doctor if: °· You bleed from your vagina. °· You have cramps. °· You have labor pains. °· You have a fever that does not go away after you take medicine. °Get help right away if: °· You have very bad cramps in your back or belly (abdomen). °· You pass large clots or tissue from your vagina. °· You bleed more. °· You feel light-headed or weak. °· You pass out (faint). °· You have chills. °· You are leaking fluid or have a gush of fluid from your vagina. °· You pass out while pooping (having a bowel movement). °This information is not intended to replace advice given to you by your health care provider. Make sure you discuss any questions you have with your health care provider. °Document Released: 10/14/2013 Document Revised: 11/05/2015 Document Reviewed: 02/04/2013 °Elsevier Interactive  Patient Education © 2017 [employer] Inc. ° °

## 2016-08-01 LAB — GC/CHLAMYDIA PROBE AMP (~~LOC~~) NOT AT ARMC
Chlamydia: NEGATIVE
Neisseria Gonorrhea: NEGATIVE

## 2016-08-27 ENCOUNTER — Emergency Department (HOSPITAL_BASED_OUTPATIENT_CLINIC_OR_DEPARTMENT_OTHER): Payer: BLUE CROSS/BLUE SHIELD

## 2016-08-27 ENCOUNTER — Encounter (HOSPITAL_BASED_OUTPATIENT_CLINIC_OR_DEPARTMENT_OTHER): Payer: Self-pay | Admitting: *Deleted

## 2016-08-27 ENCOUNTER — Emergency Department (HOSPITAL_BASED_OUTPATIENT_CLINIC_OR_DEPARTMENT_OTHER)
Admission: EM | Admit: 2016-08-27 | Discharge: 2016-08-27 | Disposition: A | Discharge status: Home / Self Care | Payer: BLUE CROSS/BLUE SHIELD | Attending: Emergency Medicine | Admitting: Emergency Medicine

## 2016-08-27 DIAGNOSIS — O9A211 Injury, poisoning and certain other consequences of external causes complicating pregnancy, first trimester: Secondary | ICD-10-CM | POA: Diagnosis not present

## 2016-08-27 DIAGNOSIS — Y9389 Activity, other specified: Secondary | ICD-10-CM | POA: Insufficient documentation

## 2016-08-27 DIAGNOSIS — Z3A13 13 weeks gestation of pregnancy: Secondary | ICD-10-CM | POA: Insufficient documentation

## 2016-08-27 DIAGNOSIS — W2203XA Walked into furniture, initial encounter: Secondary | ICD-10-CM | POA: Diagnosis not present

## 2016-08-27 DIAGNOSIS — Y929 Unspecified place or not applicable: Secondary | ICD-10-CM | POA: Diagnosis not present

## 2016-08-27 DIAGNOSIS — Z87891 Personal history of nicotine dependence: Secondary | ICD-10-CM | POA: Insufficient documentation

## 2016-08-27 DIAGNOSIS — Y999 Unspecified external cause status: Secondary | ICD-10-CM | POA: Diagnosis not present

## 2016-08-27 DIAGNOSIS — S60221A Contusion of right hand, initial encounter: Secondary | ICD-10-CM | POA: Diagnosis not present

## 2016-08-27 DIAGNOSIS — T1490XA Injury, unspecified, initial encounter: Secondary | ICD-10-CM

## 2016-08-27 MED ORDER — ACETAMINOPHEN 500 MG PO TABS
1000.0000 mg | ORAL_TABLET | Freq: Once | ORAL | Status: AC
  Administered 2016-08-27: 1000 mg via ORAL
  Filled 2016-08-27: qty 2

## 2016-08-27 NOTE — Discharge Instructions (Signed)
You may take Tylenol 1000 mg every 6 hours as needed for pain. ° ° °To find a primary care or specialty doctor please call [phone] or [phone] to access "[facility] Find a Doctor Service." ° °You may also go on the [facility] website at [email or web address] ° °There are also multiple [facility], [facility] practices throughout the [place] that are frequently accepting new patients. You may find a clinic that is close to your home and contact them. ° °[facility] -  °[address] °[place] [address] °[phone] ° ° °[facility] -  °[address] °[place] [address] [phone] ° ° °[facility] - °[address]  °[place] [address] °[phone] ° ° ° °

## 2016-08-27 NOTE — ED Triage Notes (Signed)
Pt states that she hit her right  5th digit on a door yesterday am around 0900. Woke this am and is unable to bend finger. No obvious deformity

## 2016-08-27 NOTE — ED Provider Notes (Signed)
TIME SEEN: 4:56 AM  CHIEF COMPLAINT: Right hand pain  HPI: Patient is a 26 year old right-hand-dominant female with no significant past medical history who is currently pregnant who presents to the emergency department with complaints of right hand pain. States yesterday afternoon she accidentally ran into a door frame with her right hand. States she was feeling okay but woke up tonight with increasing throbbing pain that she describes as moderate without radiation. Pain worse with movement of the hand and better with keeping it still. Patient is currently pregnant. Her last menstrual period was December 16 with a due date of September 22. Currently she is 13 weeks and 0 days pregnant. She denies abdominal pain, vaginal bleeding or discharge, dysuria or hematuria. [facility] Denies numbness or tingling to the fifth digit. States she is unable to make a fist without pain. Did not try any medications prior to arrival. States she did not hit the normal purpose.  ROS: See HPI Constitutional: no fever  Eyes: no drainage  ENT: no runny nose   Cardiovascular:  no chest pain  Resp: no SOB  GI: no vomiting GU: no dysuria Integumentary: no rash  Allergy: no hives  Musculoskeletal: no leg swelling  Neurological: no slurred speech ROS otherwise negative  PAST MEDICAL HISTORY/PAST SURGICAL HISTORY:  Past Medical History:  Diagnosis Date  . Chlamydia   . Gonorrhea   . Headache   . Infection    UTI; chlamydia  . Medical history non-contributory     MEDICATIONS:  Prior to Admission medications   Not on File    ALLERGIES:  No Known Allergies  SOCIAL HISTORY:  Social History  Substance Use Topics  . Smoking status: Former Smoker    Types: Cigarettes  . Smokeless tobacco: Never Used     Comment: quit age20  . Alcohol use No     Comment: occas.    FAMILY HISTORY: Family History  Problem Relation Age of Onset  . Diabetes Mother   .  Diabetes Sister   . Asthma Sister   . Diabetes Maternal Grandmother   . Hypertension Maternal Grandmother   . Heart disease Maternal Grandmother     EXAM: BP 116/75 (BP Location: Right Arm)   Pulse 85   Temp 98.1 F (36.7 C) (Oral)   Resp 16   Ht 5\' 7"  (1.702 m)   Wt 190 lb (86.2 kg)   LMP 05/28/2016   SpO2 98%   BMI 29.76 kg/m  CONSTITUTIONAL: Alert and oriented and responds appropriately to questions. Well-appearing; well-nourished HEAD: Normocephalic EYES: Conjunctivae clear, pupils appear equal, EOMI ENT: normal nose; moist mucous membranes NECK: Supple, no meningismus, no nuchal rigidity, no LAD  CARD: RRR; S1 and S2 appreciated; no murmurs, no clicks, no rubs, no gallops RESP: Normal chest excursion without splinting or tachypnea; breath sounds clear and equal bilaterally; no wheezes, no rhonchi, no rales, no hypoxia or respiratory distress, speaking full sentences ABD/GI: Normal bowel sounds; non-distended; soft, non-tender, no rebound, no guarding, no peritoneal signs, no hepatosplenomegaly BACK:  The back appears normal and is non-tender to palpation, there is no CVA tenderness EXT: Patient is tender to palpation over the fifth digit and fifth metacarpal without significant swelling or ecchymosis or bony deformity. I am able to passively range all of her fingers fully in the right hand but she is unable to make a fist because of pain. No scissoring of the fifth finger when the fist is closed. 2+ right  radial pulse and normal sensation throughout the right arm. Compartments in the right arm are soft. No tenderness over the wrist, forearm, elbow, humerus or shoulder of the right side. Otherwise Normal ROM in all joints; otherwise extremities are non-tender to palpation; no edema; normal capillary refill; no cyanosis, no calf tenderness or swelling    SKIN: Normal color for age and race; warm; no rash NEURO: Moves all extremities equally PSYCH: The patient's mood and manner are  appropriate. Grooming and personal hygiene are appropriate.  MEDICAL DECISION MAKING: Patient here with a right fifth hand injury. We'll obtain x-ray to rule out boxer's fracture but suspect this is a contusion. We'll give Tylenol for pain given she is pregnant. No other sign of injury on exam. Neurovascularly intact distally.  ED PROGRESS: I have reviewed patient's images as well as the radiologist and there is no sign of fracture or dislocation. Have recommended ice, elevation and Tylenol for pain. Will give PCP follow-up. Discussed return precautions.   At this time, I do not feel there is any life-threatening condition present. I have reviewed and discussed all results (EKG, imaging, lab, urine as appropriate) and exam findings with patient/family. I have reviewed nursing notes and appropriate previous records.  I feel the patient is safe to be discharged home without further emergent workup and can continue workup as an outpatient as needed. Discussed usual and customary return precautions. Patient/family verbalize understanding and are comfortable with this plan.  Outpatient follow-up has been provided if needed. All questions have been answered.      Layla MawKristen N Bernie Fobes, DO 08/27/16 [phone]

## 2016-10-29 ENCOUNTER — Emergency Department (HOSPITAL_BASED_OUTPATIENT_CLINIC_OR_DEPARTMENT_OTHER)
Admission: EM | Admit: 2016-10-29 | Discharge: 2016-10-29 | Disposition: A | Discharge status: Home / Self Care | Payer: BLUE CROSS/BLUE SHIELD | Attending: Emergency Medicine | Admitting: Emergency Medicine

## 2016-10-29 ENCOUNTER — Encounter (HOSPITAL_BASED_OUTPATIENT_CLINIC_OR_DEPARTMENT_OTHER): Payer: Self-pay | Admitting: *Deleted

## 2016-10-29 DIAGNOSIS — R1030 Lower abdominal pain, unspecified: Secondary | ICD-10-CM | POA: Diagnosis not present

## 2016-10-29 DIAGNOSIS — Z87891 Personal history of nicotine dependence: Secondary | ICD-10-CM | POA: Insufficient documentation

## 2016-10-29 DIAGNOSIS — R109 Unspecified abdominal pain: Secondary | ICD-10-CM | POA: Diagnosis present

## 2016-10-29 LAB — URINALYSIS, ROUTINE W REFLEX MICROSCOPIC
Bilirubin Urine: NEGATIVE
Glucose, UA: NEGATIVE mg/dL
Hgb urine dipstick: NEGATIVE
Ketones, ur: NEGATIVE mg/dL
LEUKOCYTES UA: NEGATIVE
NITRITE: NEGATIVE
PROTEIN: NEGATIVE mg/dL
SPECIFIC GRAVITY, URINE: 1.014 (ref 1.005–1.030)
pH: 7.5 (ref 5.0–8.0)

## 2016-10-29 LAB — PREGNANCY, URINE: Preg Test, Ur: NEGATIVE

## 2016-10-29 MED ORDER — ONDANSETRON 4 MG PO TBDP: 4.0000 mg | ORAL_TABLET | Freq: Three times a day (TID) | ORAL | 0 refills | Status: DC | PRN

## 2016-10-29 MED ORDER — ONDANSETRON 8 MG PO TBDP
8.0000 mg | ORAL_TABLET | Freq: Once | ORAL | Status: AC
  Administered 2016-10-29: 8 mg via ORAL
  Filled 2016-10-29: qty 1

## 2016-10-29 NOTE — ED Notes (Signed)
Pt ambulatory without assistance, in NAD.

## 2016-10-29 NOTE — ED Provider Notes (Signed)
[facility] Note   CSN: [id number] Arrival date & time: 10/29/16  0820   History   Chief Complaint No chief complaint on file.   HPI Emily Lowery is a 26 y.o. female.  HPI Patient presents to ED with abdominal pain. Patient states that she started having stomach pain yesterday. She felt sick to her stomach. This morning the pain has continued. States that she has pain with bowel movements. Does feel somewhat better after bowel movement. Denies blood in bowel movement. She denies any diarrhea or constipation. Denies any vomiting. Has had some nausea. Abdominal pain is intermittent and described as sharp. Pain over the last for a few seconds. Has never had this type of pain before. Has not taken anything for the pain.   Past Medical History:  Diagnosis Date  . Chlamydia   . Gonorrhea   . Headache   . Infection    UTI; chlamydia  . Medical history non-contributory     There are no active problems to display for this patient.   Past Surgical History:  Procedure Laterality Date  . TONSILLECTOMY    . WISDOM TOOTH EXTRACTION      OB History    Gravida Para Term Preterm AB Living   1             SAB TAB Ectopic Multiple Live Births                   Home Medications    Prior to Admission medications   Not on File    Family History Family History  Problem Relation Age of Onset  . Diabetes Mother   . Diabetes Sister   . Asthma Sister   . Diabetes Maternal Grandmother   . Hypertension Maternal Grandmother   . Heart disease Maternal Grandmother     Social History Social History  Substance Use Topics  . Smoking status: Former Smoker    Types: Cigarettes  . Smokeless tobacco: Never Used     Comment: quit age20  . Alcohol use No     Comment: occas.     Allergies   Patient has no known allergies.   Review of Systems Review of Systems  Constitutional: Negative for chills and fever.  HENT: Negative.   Respiratory: Negative for  shortness of breath.   Cardiovascular: Negative for chest pain.  Gastrointestinal: Positive for abdominal pain.   Also per HPI  Physical Exam Updated Vital Signs BP 118/70 (BP Location: Left Arm)   Pulse 90   Temp 98.8 F (37.1 C) (Oral)   Resp 16   Ht 5\' 6"  (1.676 m)   Wt 190 lb (86.2 kg)   LMP 05/28/2016   SpO2 100%   BMI 30.67 kg/m   Physical Exam  Constitutional: She appears well-developed and well-nourished. No distress.  HENT:  Head: Normocephalic and atraumatic.  Eyes: Conjunctivae and EOM are normal.  Cardiovascular: Normal rate and regular rhythm.   Pulmonary/Chest: Effort normal and breath sounds normal.  Abdominal: Soft. Bowel sounds are normal. She exhibits no distension. There is no tenderness. There is no rebound and no guarding.  Musculoskeletal: Normal range of motion. She exhibits no edema.  Neurological: She is alert.  Skin: Skin is warm and dry.  Psychiatric: She has a normal mood and affect.  Nursing note and vitals reviewed.   ED Treatments / Results  Labs (all labs ordered are listed, but only abnormal results are displayed) Labs Reviewed  URINALYSIS, ROUTINE W REFLEX  MICROSCOPIC  PREGNANCY, URINE    EKG  EKG Interpretation None       Radiology No results found.  Procedures Procedures (including critical care time)  Medications Ordered in ED Medications  ondansetron (ZOFRAN-ODT) disintegrating tablet 8 mg (not administered)    Initial Impression / Assessment and Plan / ED Course  I have reviewed the triage vital signs and the nursing notes.  Pertinent labs & imaging results that were available during my care of the patient were reviewed by me and considered in my medical decision making (see chart for details).  Patient presents to ED with abdominal pain. Pain started yesterday. No red flags. On physical exam has negative abdominal exam. Cause of pain largely unknown. Did feel some improvement with Zofran that was given in ED. Rx  given for Zofran at home. With association with bowel movements and decreased pain with bowel movement consider functional bowel syndrome such as IBS. UA and Upreg negative. Patient to establish care with PCP for further workup. Work note provided.    Final Clinical Impressions(s) / ED Diagnoses   Final diagnoses:  Lower abdominal pain    New Prescriptions New Prescriptions   No medications on file     Pincus Largehelps, Sascha Palma Y, DO 10/29/16 1031

## 2016-10-29 NOTE — ED Provider Notes (Signed)
I have personally seen and examined the patient. I have reviewed the documentation on PMH/FH/Soc Hx. I have discussed the plan of care with the resident and patient.  I have reviewed and agree with the resident's documentation. Please see associated encounter note.   EKG Interpretation None         Ariyanah Aguado, Amadeo GarnetPedro Eduardo, MD 10/29/16 [phone]

## 2016-10-29 NOTE — Discharge Instructions (Addendum)
Return to ED if symptoms worsen Need to establish with PCP  Abdominal pain cause unknown at this time but does not appear life threatening

## 2016-10-29 NOTE — ED Triage Notes (Signed)
Patient states she has had mid to lower abdominal pain for the last 24 hours.  Pain is associated with nausea.  Denies vaginal discharge or drainage; or urinary symptoms.

## 2016-10-29 NOTE — ED Notes (Signed)
ED Provider at bedside. 

## 2017-03-20 ENCOUNTER — Inpatient Hospital Stay (HOSPITAL_COMMUNITY)
Admission: AD | Admit: 2017-03-20 | Discharge: 2017-03-20 | Disposition: A | Discharge status: Home / Self Care | Payer: BLUE CROSS/BLUE SHIELD | Source: Ambulatory Visit | Attending: Obstetrics & Gynecology | Admitting: Obstetrics & Gynecology

## 2017-03-20 DIAGNOSIS — N760 Acute vaginitis: Secondary | ICD-10-CM

## 2017-03-20 DIAGNOSIS — Z3202 Encounter for pregnancy test, result negative: Secondary | ICD-10-CM | POA: Insufficient documentation

## 2017-03-20 DIAGNOSIS — B373 Candidiasis of vulva and vagina: Secondary | ICD-10-CM

## 2017-03-20 DIAGNOSIS — Z87891 Personal history of nicotine dependence: Secondary | ICD-10-CM | POA: Diagnosis not present

## 2017-03-20 DIAGNOSIS — L293 Anogenital pruritus, unspecified: Secondary | ICD-10-CM | POA: Diagnosis present

## 2017-03-20 DIAGNOSIS — B3731 Acute candidiasis of vulva and vagina: Secondary | ICD-10-CM

## 2017-03-20 DIAGNOSIS — B9689 Other specified bacterial agents as the cause of diseases classified elsewhere: Secondary | ICD-10-CM | POA: Insufficient documentation

## 2017-03-20 LAB — URINALYSIS, ROUTINE W REFLEX MICROSCOPIC
BACTERIA UA: NONE SEEN
BILIRUBIN URINE: NEGATIVE
Glucose, UA: NEGATIVE mg/dL
HGB URINE DIPSTICK: NEGATIVE
KETONES UR: NEGATIVE mg/dL
NITRITE: NEGATIVE
Protein, ur: NEGATIVE mg/dL
Specific Gravity, Urine: 1.021 (ref 1.005–1.030)
pH: 5 (ref 5.0–8.0)

## 2017-03-20 LAB — HIV ANTIBODY (ROUTINE TESTING W REFLEX): HIV Screen 4th Generation wRfx: NONREACTIVE

## 2017-03-20 LAB — GC/CHLAMYDIA PROBE AMP (~~LOC~~) NOT AT ARMC
CHLAMYDIA, DNA PROBE: NEGATIVE
Neisseria Gonorrhea: NEGATIVE

## 2017-03-20 LAB — WET PREP, GENITAL
SPERM: NONE SEEN
Trich, Wet Prep: NONE SEEN

## 2017-03-20 LAB — POCT PREGNANCY, URINE: PREG TEST UR: NEGATIVE

## 2017-03-20 MED ORDER — METRONIDAZOLE 500 MG PO TABS: 500.0000 mg | ORAL_TABLET | Freq: Two times a day (BID) | ORAL | 0 refills | Status: DC

## 2017-03-20 MED ORDER — FLUCONAZOLE 150 MG PO TABS: 150.0000 mg | ORAL_TABLET | Freq: Once | ORAL | 1 refills | Status: AC

## 2017-03-20 NOTE — MAU Provider Note (Signed)
Chief Complaint: Vaginal Itching    First Provider Initiated Contact with Patient 03/20/17 [phone]       SUBJECTIVE HPI: Emily Lowery is a 26 y.o. G1P0 female who presents to Maternity Admissions reporting vaginal irritation 2 days and concern for pregnancy. Patient states she has a gynecologist, but is unable to go there because of a mix up with her insurance.  Modifying factors: Hasn't tried any treatments for her symptoms. Hasn't taken UPT. Associated signs and symptoms: Negative for fever, chills, vaginal bleeding, abdominal pain, dyspareunia.  Past Medical History:  Diagnosis Date  . Chlamydia   . Gonorrhea   . Headache   . Infection    UTI; chlamydia  . Medical history non-contributory    OB History  Gravida Para Term Preterm AB Living  1            SAB TAB Ectopic Multiple Live Births               # Outcome Date GA Lbr Len/2nd Weight Sex Delivery Anes PTL Lv  1 Gravida              Past Surgical History:  Procedure Laterality Date  . TONSILLECTOMY    . WISDOM TOOTH EXTRACTION     Social History   Social History  . Marital status: Single    Spouse name: N/A  . Number of children: N/A  . Years of education: N/A   Occupational History  . Not on file.   Social History Main Topics  . Smoking status: Former Smoker    Types: Cigarettes  . Smokeless tobacco: Never Used     Comment: quit age20  . Alcohol use No     Comment: occas.  . Drug use: No  . Sexual activity: Yes    Birth control/ protection: None   Other Topics Concern  . Not on file   Social History Narrative  . No narrative on file   Family History  Problem Relation Age of Onset  . Diabetes Mother   . Diabetes Sister   . Asthma Sister   . Diabetes Maternal Grandmother   . Hypertension Maternal Grandmother   . Heart disease Maternal Grandmother    No current facility-administered medications on file prior to encounter.    Current Outpatient Prescriptions on File Prior to Encounter   Medication Sig Dispense Refill  . ondansetron (ZOFRAN ODT) 4 MG disintegrating tablet Take 1 tablet (4 mg total) by mouth every 8 (eight) hours as needed for nausea or vomiting. 10 tablet 0   No Known Allergies  I have reviewed patient's Past Medical Hx, Surgical Hx, Family Hx, Social Hx, medications and allergies.   Review of Systems  Constitutional: Negative for chills and fever.  Gastrointestinal: Negative for abdominal pain, nausea and vomiting.  Genitourinary: Positive for vaginal discharge. Negative for dysuria and vaginal bleeding.    OBJECTIVE Patient Vitals for the past 24 hrs:  BP Temp Temp src Pulse Resp SpO2  03/20/17 0617 126/70 98.1 F (36.7 C) Oral 71 18 -  03/20/17 0437 118/64 98 F (36.7 C) Oral 75 18 100 %   Constitutional: Well-developed, well-nourished female in no acute distress.  Cardiovascular: normal rate Respiratory: normal rate and effort.  GI: Abd soft, non-tender. MS: Extremities nontender, no edema, normal ROM Neurologic: Alert and oriented x 4.  GU:  PELVIC EXAM: NEFG, Small amount of thick, white, malodorous, curd-like discharge, no blood noted.    LAB RESULTS Results for orders placed or performed  during the hospital encounter of 03/20/17 (from the past 24 hour(s))  Urinalysis, Routine w reflex microscopic     Status: Abnormal   Collection Time: 03/20/17  4:55 AM  Result Value Ref Range   Color, Urine YELLOW YELLOW   APPearance HAZY (A) CLEAR   Specific Gravity, Urine 1.021 1.005 - 1.030   pH 5.0 5.0 - 8.0   Glucose, UA NEGATIVE NEGATIVE mg/dL   Hgb urine dipstick NEGATIVE NEGATIVE   Bilirubin Urine NEGATIVE NEGATIVE   Ketones, ur NEGATIVE NEGATIVE mg/dL   Protein, ur NEGATIVE NEGATIVE mg/dL   Nitrite NEGATIVE NEGATIVE   Leukocytes, UA TRACE (A) NEGATIVE   RBC / HPF 0-5 0 - 5 RBC/hpf   WBC, UA 0-5 0 - 5 WBC/hpf   Bacteria, UA NONE SEEN NONE SEEN   Squamous Epithelial / LPF 6-30 (A) NONE SEEN   Mucus PRESENT   Pregnancy, urine POC      Status: None   Collection Time: 03/20/17  5:03 AM  Result Value Ref Range   Preg Test, Ur NEGATIVE NEGATIVE  Wet prep, genital     Status: Abnormal   Collection Time: 03/20/17  5:04 AM  Result Value Ref Range   Yeast Wet Prep HPF POC PRESENT (A) NONE SEEN   Trich, Wet Prep NONE SEEN NONE SEEN   Clue Cells Wet Prep HPF POC PRESENT (A) NONE SEEN   WBC, Wet Prep HPF POC FEW (A) NONE SEEN   Sperm NONE SEEN     IMAGING No results found.  MAU COURSE Orders Placed This Encounter  Procedures  . Wet prep, genital  . Urinalysis, Routine w reflex microscopic  . HIV antibody  . Lab instructions  . Pregnancy, urine POC  . Discharge patient   Meds ordered this encounter  Medications  . fluconazole (DIFLUCAN) 150 MG tablet    Sig: Take 1 tablet (150 mg total) by mouth once. Can take additional dose three days later if symptoms persist    Dispense:  2 tablet    Refill:  1    Order Specific Question:   Supervising Provider    Answer:   EURE, LUTHER H [[id number]]  . metroNIDAZOLE (FLAGYL) 500 MG tablet    Sig: Take 1 tablet (500 mg total) by mouth 2 (two) times daily.    Dispense:  14 tablet    Refill:  0    Order Specific Question:   Supervising Provider    Answer:   Lazaro Arms [[id number]]    MDM - Vaginal irritation secondary to yeast infection and bacterial vaginosis. Will treat with Diflucan and Flagyl. Requested GC/chlamydia and HIV testing. Results pending.  ASSESSMENT 1. Vaginal yeast infection   2. Bacterial vaginosis     PLAN Discharge home in stable condition. STD precautions Discussed changes in maternity admissions and hospital moves. Encourage patient to resume care with her gynecologist for nonemergent needs. Follow-up Information    Gynecologist of your choice Follow up.   Why:  Routine gynecology care         Allergies as of 03/20/2017   No Known Allergies     Medication List    STOP taking these medications   ondansetron 4 MG disintegrating  tablet Commonly known as:  ZOFRAN ODT     TAKE these medications   fluconazole 150 MG tablet Commonly known as:  DIFLUCAN Take 1 tablet (150 mg total) by mouth once. Can take additional dose three days later if symptoms persist   metroNIDAZOLE 500 MG  tablet Commonly known as:  FLAGYL Take 1 tablet (500 mg total) by mouth 2 (two) times daily.        Katrinka Blazing, [place], CNM 03/20/2017  6:26 AM

## 2017-03-20 NOTE — MAU Note (Signed)
Vaginal irritation since 03/18/17

## 2017-03-20 NOTE — Discharge Instructions (Signed)
Bacterial Vaginosis Bacterial vaginosis is a vaginal infection that occurs when the normal balance of bacteria in the vagina is disrupted. It results from an overgrowth of certain bacteria. This is the most common vaginal infection among women ages 15-44. Because bacterial vaginosis increases your risk for STIs (sexually transmitted infections), getting treated can help reduce your risk for chlamydia, gonorrhea, herpes, and HIV (human immunodeficiency virus). Treatment is also important for preventing complications in pregnant women, because this condition can cause an early (premature) delivery. What are the causes? This condition is caused by an increase in harmful bacteria that are normally present in small amounts in the vagina. However, the reason that the condition develops is not fully understood. What increases the risk? The following factors may make you more likely to develop this condition:  Having a new sexual partner or multiple sexual partners.  Having unprotected sex.  Douching.  Having an intrauterine device (IUD).  Smoking.  Drug and alcohol abuse.  Taking certain antibiotic medicines.  Being pregnant.  You cannot get bacterial vaginosis from toilet seats, bedding, swimming pools, or contact with objects around you. What are the signs or symptoms? Symptoms of this condition include:  Grey or white vaginal discharge. The discharge can also be watery or foamy.  A fish-like odor with discharge, especially after sexual intercourse or during menstruation.  Itching in and around the vagina.  Burning or pain with urination.  Some women with bacterial vaginosis have no signs or symptoms. How is this diagnosed? This condition is diagnosed based on:  Your medical history.  A physical exam of the vagina.  Testing a sample of vaginal fluid under a microscope to look for a large amount of bad bacteria or abnormal cells. Your health care provider may use a cotton swab  or a small wooden spatula to collect the sample.  How is this treated? This condition is treated with antibiotics. These may be given as a pill, a vaginal cream, or a medicine that is put into the vagina (suppository). If the condition comes back after treatment, a second round of antibiotics may be needed. Follow these instructions at home: Medicines  Take over-the-counter and prescription medicines only as told by your health care provider.  Take or use your antibiotic as told by your health care provider. Do not stop taking or using the antibiotic even if you start to feel better. General instructions  If you have a female sexual partner, tell her that you have a vaginal infection. She should see her health care provider and be treated if she has symptoms. If you have a female sexual partner, he does not need treatment.  During treatment: ? Avoid sexual activity until you finish treatment. ? Do not douche. ? Avoid alcohol as directed by your health care provider. ? Avoid breastfeeding as directed by your health care provider.  Drink enough water and fluids to keep your urine clear or pale yellow.  Keep the area around your vagina and rectum clean. ? Wash the area daily with warm water. ? Wipe yourself from front to back after using the toilet.  Keep all follow-up visits as told by your health care provider. This is important. How is this prevented?  Do not douche.  Wash the outside of your vagina with warm water only.  Use protection when having sex. This includes latex condoms and dental dams.  Limit how many sexual partners you have. To help prevent bacterial vaginosis, it is best to have sex with just   one partner (monogamous).  Make sure you and your sexual partner are tested for STIs.  Wear cotton or cotton-lined underwear.  Avoid wearing tight pants and pantyhose, especially during summer.  Limit the amount of alcohol that you drink.  Do not use any products that  contain nicotine or tobacco, such as cigarettes and e-cigarettes. If you need help quitting, ask your health care provider.  Do not use illegal drugs. Where to find more information:  Centers for Disease Control and Prevention: www.cdc.gov/std  American Sexual Health Association (ASHA): www.ashastd.org  [place] Department of Health and Human Services, Office on Women's Health: www.womenshealth.gov/ or https://www.womenshealth.gov/a-z-topics/bacterial-vaginosis Contact a health care provider if:  Your symptoms do not improve, even after treatment.  You have more discharge or pain when urinating.  You have a fever.  You have pain in your abdomen.  You have pain during sex.  You have vaginal bleeding between periods. Summary  Bacterial vaginosis is a vaginal infection that occurs when the normal balance of bacteria in the vagina is disrupted.  Because bacterial vaginosis increases your risk for STIs (sexually transmitted infections), getting treated can help reduce your risk for chlamydia, gonorrhea, herpes, and HIV (human immunodeficiency virus). Treatment is also important for preventing complications in pregnant women, because the condition can cause an early (premature) delivery.  This condition is treated with antibiotic medicines. These may be given as a pill, a vaginal cream, or a medicine that is put into the vagina (suppository). This information is not intended to replace advice given to you by your health care provider. Make sure you discuss any questions you have with your health care provider. Document Released: 05/30/2005 Document Revised: 02/13/2016 Document Reviewed: 02/13/2016 Elsevier Interactive Patient Education  2017 [employer] Vaginal Yeast infection, Adult Vaginal yeast infection is a condition that causes soreness, swelling, and redness (inflammation) of the vagina. It also causes vaginal discharge. This is a common condition. Some women get this infection  frequently. What are the causes? This condition is caused by a change in the normal balance of the yeast (candida) and bacteria that live in the vagina. This change causes an overgrowth of yeast, which causes the inflammation. What increases the risk? This condition is more likely to develop in:  Women who take antibiotic medicines.  Women who have diabetes.  Women who take birth control pills.  Women who are pregnant.  Women who douche often.  Women who have a weak defense (immune) system.  Women who have been taking steroid medicines for a long time.  Women who frequently wear tight clothing.  What are the signs or symptoms? Symptoms of this condition include:  White, thick vaginal discharge.  Swelling, itching, redness, and irritation of the vagina. The lips of the vagina (vulva) may be affected as well.  Pain or a burning feeling while urinating.  Pain during sex.  How is this diagnosed? This condition is diagnosed with a medical history and physical exam. This will include a pelvic exam. Your health care provider will examine a sample of your vaginal discharge under a microscope. Your health care provider may send this sample for testing to confirm the diagnosis. How is this treated? This condition is treated with medicine. Medicines may be over-the-counter or prescription. You may be told to use one or more of the following:  Medicine that is taken orally.  Medicine that is applied as a cream.  Medicine that is inserted directly into the vagina (suppository).  Follow these instructions at home:    Take or apply over-the-counter and prescription medicines only as told by your health care provider.  Do not have sex until your health care provider has approved. Tell your sex partner that you have a yeast infection. That person should go to his or her health care provider if he or she develops symptoms.  Do not wear tight clothes, such as pantyhose or tight  pants.  Avoid using tampons until your health care provider approves.  Eat more yogurt. This may help to keep your yeast infection from returning.  Try taking a sitz bath to help with discomfort. This is a warm water bath that is taken while you are sitting down. The water should only come up to your hips and should cover your buttocks. Do this 3-4 times per day or as told by your health care provider.  Do not douche.  Wear breathable, cotton underwear.  If you have diabetes, keep your blood sugar levels under control. Contact a health care provider if:  You have a fever.  Your symptoms go away and then return.  Your symptoms do not get better with treatment.  Your symptoms get worse.  You have new symptoms.  You develop blisters in or around your vagina.  You have blood coming from your vagina and it is not your menstrual period.  You develop pain in your abdomen. This information is not intended to replace advice given to you by your health care provider. Make sure you discuss any questions you have with your health care provider. Document Released: 03/09/2005 Document Revised: 11/11/2015 Document Reviewed: 12/01/2014 Elsevier Interactive Patient Education  2018 [employer] Inc.  

## 2017-06-20 ENCOUNTER — Other Ambulatory Visit: Payer: Self-pay

## 2017-06-20 ENCOUNTER — Encounter (HOSPITAL_BASED_OUTPATIENT_CLINIC_OR_DEPARTMENT_OTHER): Payer: Self-pay

## 2017-06-20 ENCOUNTER — Emergency Department (HOSPITAL_BASED_OUTPATIENT_CLINIC_OR_DEPARTMENT_OTHER)
Admission: EM | Admit: 2017-06-20 | Discharge: 2017-06-20 | Disposition: A | Discharge status: Home / Self Care | Payer: BLUE CROSS/BLUE SHIELD | Attending: Emergency Medicine | Admitting: Emergency Medicine

## 2017-06-20 DIAGNOSIS — Z5321 Procedure and treatment not carried out due to patient leaving prior to being seen by health care provider: Secondary | ICD-10-CM | POA: Insufficient documentation

## 2017-06-20 DIAGNOSIS — R197 Diarrhea, unspecified: Secondary | ICD-10-CM | POA: Insufficient documentation

## 2017-06-20 LAB — URINALYSIS, MICROSCOPIC (REFLEX): WBC UA: NONE SEEN WBC/hpf (ref 0–5)

## 2017-06-20 LAB — URINALYSIS, ROUTINE W REFLEX MICROSCOPIC
BILIRUBIN URINE: NEGATIVE
Glucose, UA: NEGATIVE mg/dL
Ketones, ur: NEGATIVE mg/dL
Leukocytes, UA: NEGATIVE
NITRITE: NEGATIVE
PH: 6 (ref 5.0–8.0)
Protein, ur: NEGATIVE mg/dL

## 2017-06-20 LAB — PREGNANCY, URINE: Preg Test, Ur: NEGATIVE

## 2017-06-20 NOTE — ED Notes (Signed)
PT PAGED IN LOBBY. NO ANSWER 

## 2017-06-20 NOTE — ED Triage Notes (Signed)
C/o and pain x 2 weeks-diarrhea yesterday-NAD-steady gait

## 2017-06-26 ENCOUNTER — Encounter (HOSPITAL_BASED_OUTPATIENT_CLINIC_OR_DEPARTMENT_OTHER): Payer: Self-pay | Admitting: *Deleted

## 2017-06-26 ENCOUNTER — Other Ambulatory Visit: Payer: Self-pay

## 2017-06-26 ENCOUNTER — Emergency Department (HOSPITAL_BASED_OUTPATIENT_CLINIC_OR_DEPARTMENT_OTHER)
Admission: EM | Admit: 2017-06-26 | Discharge: 2017-06-26 | Disposition: A | Discharge status: Home / Self Care | Payer: BLUE CROSS/BLUE SHIELD | Attending: Emergency Medicine | Admitting: Emergency Medicine

## 2017-06-26 ENCOUNTER — Emergency Department (HOSPITAL_BASED_OUTPATIENT_CLINIC_OR_DEPARTMENT_OTHER): Payer: BLUE CROSS/BLUE SHIELD

## 2017-06-26 DIAGNOSIS — Z87891 Personal history of nicotine dependence: Secondary | ICD-10-CM | POA: Insufficient documentation

## 2017-06-26 DIAGNOSIS — M549 Dorsalgia, unspecified: Secondary | ICD-10-CM | POA: Diagnosis not present

## 2017-06-26 DIAGNOSIS — R05 Cough: Secondary | ICD-10-CM | POA: Insufficient documentation

## 2017-06-26 DIAGNOSIS — Y9389 Activity, other specified: Secondary | ICD-10-CM | POA: Insufficient documentation

## 2017-06-26 DIAGNOSIS — Y929 Unspecified place or not applicable: Secondary | ICD-10-CM | POA: Diagnosis not present

## 2017-06-26 DIAGNOSIS — Y999 Unspecified external cause status: Secondary | ICD-10-CM | POA: Diagnosis not present

## 2017-06-26 DIAGNOSIS — X501XXA Overexertion from prolonged static or awkward postures, initial encounter: Secondary | ICD-10-CM | POA: Diagnosis not present

## 2017-06-26 DIAGNOSIS — R067 Sneezing: Secondary | ICD-10-CM | POA: Diagnosis not present

## 2017-06-26 DIAGNOSIS — M25512 Pain in left shoulder: Secondary | ICD-10-CM | POA: Diagnosis not present

## 2017-06-26 MED ORDER — NAPROXEN 375 MG PO TABS: 375.0000 mg | ORAL_TABLET | Freq: Two times a day (BID) | ORAL | 0 refills | Status: DC

## 2017-06-26 MED ORDER — CYCLOBENZAPRINE HCL 10 MG PO TABS: 10.0000 mg | ORAL_TABLET | Freq: Two times a day (BID) | ORAL | 0 refills | Status: DC | PRN

## 2017-06-26 MED ORDER — LIDOCAINE 5 % EX PTCH: 1.0000 | MEDICATED_PATCH | CUTANEOUS | 0 refills | Status: DC

## 2017-06-26 MED FILL — NAPROXEN 375 MG TABS: 375 | 10 days supply | Qty: 20 | Fill #0

## 2017-06-26 MED FILL — CYCLOBENZAPRINE HCL 10 MG T: 10 | 6 days supply | Qty: 12 | Fill #0

## 2017-06-26 NOTE — ED Triage Notes (Signed)
Left shoulder pain after sitting down in a chair yesterday. Limited ROM.

## 2017-06-26 NOTE — ED Notes (Signed)
Pt. Is sneezing and coughing also.

## 2017-06-26 NOTE — Discharge Instructions (Signed)
Your x-ray did not show any acute findings.  Given that you are very tender over your upper back to palpation this is likely more musculoskeletal. Please take the Naproxen as prescribed for pain. Do not take any additional NSAIDs including Motrin, Aleve, Ibuprofen, Advil. Please the the flexeril for muscle relaxation. This medication will make you drowsy so avoid situation that could place you in danger.  Please apply the lidocaine patch to the area of most pain.  Tonight heat and ice.  Perform the range of motion's as discussed.  If you are not feeling better in 2-3 days or feel worse within this time return the ED for further evaluation.  If you develop any chest pain, shortness of breath, difficulties breathing, fevers return immediately to the ED.  Have given you follow-up with orthopedic doctor.

## 2017-06-26 NOTE — ED Provider Notes (Signed)
[facility] EMERGENCY DEPARTMENT Provider Note   CSN: [id number] Arrival date & time: 06/26/17  1336     History   Chief Complaint Chief Complaint  Patient presents with  . Shoulder Pain    HPI Emily Lowery is a 27 y.o. female.  HPI 27 year old AA female with no pertinent past medical history presents to the emergency department today for evaluation of left upper back and shoulder pain.  Patient states that her symptoms started yesterday after sitting in a chair.  She states the pain is made worse with palpation and range of motion the left shoulder and arm.  States the pain is now radiating down her left arm.  Patient states that she did develop a cold and flulike symptoms 2 days ago.  She has been coughing, sneezing, rhinorrhea.  Patient states that she has not taken anything for her pain because she knows that nothing will work.  He denies any associated chest pain or shortness of breath.  Denies any known injury.  Patient denies any history of DVT/PE, prolonged immobilization or recent hospitalization/surgeries, unilateral leg swelling or calf tenderness, tobacco use, hormone use, hemoptysis. Past Medical History:  Diagnosis Date  . Chlamydia   . Gonorrhea   . Headache   . Infection    UTI; chlamydia  . Medical history non-contributory     There are no active problems to display for this patient.   Past Surgical History:  Procedure Laterality Date  . TONSILLECTOMY    . WISDOM TOOTH EXTRACTION      OB History    Gravida Para Term Preterm AB Living   1             SAB TAB Ectopic Multiple Live Births                   Home Medications    Prior to Admission medications   Medication Sig Start Date End Date Taking? Authorizing Provider  cyclobenzaprine (FLEXERIL) 10 MG tablet Take 1 tablet (10 mg total) by mouth 2 (two) times daily as needed for muscle spasms. 06/26/17   Demetrios Loll T, PA-C  lidocaine (LIDODERM) 5 % Place 1 patch onto the skin  daily. Remove & Discard patch within 12 hours or as directed by MD 06/26/17   Rise Mu, PA-C  naproxen (NAPROSYN) 375 MG tablet Take 1 tablet (375 mg total) by mouth 2 (two) times daily. 06/26/17   Rise Mu, PA-C    Family History Family History  Problem Relation Age of Onset  . Diabetes Mother   . Diabetes Sister   . Asthma Sister   . Diabetes Maternal Grandmother   . Hypertension Maternal Grandmother   . Heart disease Maternal Grandmother     Social History Social History   Tobacco Use  . Smoking status: Former Smoker    Types: Cigarettes  . Smokeless tobacco: Never Used  . Tobacco comment: quit age20  Substance Use Topics  . Alcohol use: Yes    Comment: occ  . Drug use: No     Allergies   Patient has no known allergies.   Review of Systems Review of Systems  Constitutional: Negative for chills and fever.  HENT: Positive for congestion, rhinorrhea and sneezing. Negative for sore throat.   Respiratory: Negative for shortness of breath and wheezing.   Cardiovascular: Negative for chest pain.  Gastrointestinal: Negative for nausea and vomiting.  Musculoskeletal: Positive for arthralgias, back pain and myalgias. Negative for gait problem, joint  swelling, neck pain and neck stiffness.  Skin: Negative for color change and rash.  Neurological: Negative for weakness and numbness.     Physical Exam Updated Vital Signs BP 123/74 (BP Location: Left Arm)   Pulse 100   Temp 98.8 F (37.1 C) (Oral)   Resp 18   Ht 5\' 6"  (1.676 m)   Wt 89.4 kg (197 lb)   LMP  (LMP Unknown)   SpO2 100%   BMI 31.80 kg/m   Physical Exam  Constitutional: She appears well-developed and well-nourished. No distress.  HENT:  Head: Normocephalic and atraumatic.  Mouth/Throat: Oropharynx is clear and moist. No oropharyngeal exudate.  Rhinorrhea nasal congestion noted.  Eyes: Right eye exhibits no discharge. Left eye exhibits no discharge. No scleral icterus.  Neck:  Normal range of motion. Neck supple.  No c spine midline tenderness. No paraspinal tenderness. No deformities or step offs noted. Full ROM. Supple. No nuchal rigidity.    Pulmonary/Chest: Effort normal and breath sounds normal. No stridor. No respiratory distress. She has no wheezes. She has no rales. She exhibits no tenderness.  Musculoskeletal: Normal range of motion.       Back:  Patient is very tender to palpation over the left upper back with tense musculature noted.  Patient is unable to lift her arm over her shoulder due to the pain.  Patient is a positive Neer's test and hawkins.  She has no bicep tendon tenderness to palpation.  The pain does not radiate.  No rashes noted.  Radial pulses 2+ bilaterally.  Good grip strength bilaterally.  Sensation intact.  No midline T spine or L spine tenderness. No deformities or step offs noted. Full ROM. Pelvis is stable.  No lower extremity edema. No calf tenderness.    Neurological: She is alert.  Skin: Skin is warm and dry. Capillary refill takes less than 2 seconds. No pallor.  Psychiatric: Her behavior is normal. Judgment and thought content normal.  Nursing note and vitals reviewed.    ED Treatments / Results  Labs (all labs ordered are listed, but only abnormal results are displayed) Labs Reviewed - No data to display  EKG  EKG Interpretation None       Radiology Dg Shoulder Left  Result Date: 06/26/2017 CLINICAL DATA:  Pain for 2 days EXAM: LEFT SHOULDER - 2+ VIEW COMPARISON:  None. FINDINGS: Frontal, Y scapular, and axillary images were obtained. No fracture or dislocation. Joint spaces appear normal. No erosive change. Visualized left lung clear. IMPRESSION: No fracture or dislocation.  No evident arthropathy. Electronically Signed   By: Bretta Bang III M.D.   On: 06/26/2017 14:59    Procedures Procedures (including critical care time)  Medications Ordered in ED Medications - No data to display   Initial  Impression / Assessment and Plan / ED Course  I have reviewed the triage vital signs and the nursing notes.  Pertinent labs & imaging results that were available during my care of the patient were reviewed by me and considered in my medical decision making (see chart for details).     Patient presents to the ED with complaints of left upper back and left shoulder pain that started yesterday.  Patient reports having URI symptoms with sneezing and coughing.  Patient states the pain is worse with palpation or range of motion of the left shoulder.  Patient denies any PE risk factors.  Denies any cardiac history.  Denies any other known injury.  She is overall well-appearing and reassuring.  Patient is afebrile in the ED.  On exam patient is neurovascularly intact.  Lungs clear to auscultation bilaterally.  Heart regular rate and rhythm.  The patient's pain is reproducible on palpation to the left upper back with tense musculature noted.  Patient does have a positive Neer's and Hawkins test.  Range of motion makes the pain worse.  She has no erythema or warmth of the joint that would be consistent with a septic arthritis.  X-ray was unremarkable.  Visualized lung does not show any signs of thorax.  Given the patient's pain is very reproducible likely musculoskeletal secondary from coughing and sneezing.  Clinical presentation does not seem consistent with PE, dissection, ACS.  Will treat patient symptomatically with muscle relaxers, anti-inflammatories and lidocaine patches.  Encouraged follow-up in 2-3 days if symptoms not improving and very strict return precautions if symptoms worsen.  Pt is hemodynamically stable, in NAD, & able to ambulate in the ED. Evaluation does not show pathology that would require ongoing emergent intervention or inpatient treatment. I explained the diagnosis to the patient. Pain has been managed & has no complaints prior to dc. Pt is comfortable with above plan and is stable  for discharge at this time. All questions were answered prior to disposition. Strict return precautions for f/u to the ED were discussed. Encouraged follow up with PCP.   Final Clinical Impressions(s) / ED Diagnoses   Final diagnoses:  Acute pain of left shoulder    ED Discharge Orders        Ordered    naproxen (NAPROSYN) 375 MG tablet  2 times daily     06/26/17 1546    cyclobenzaprine (FLEXERIL) 10 MG tablet  2 times daily PRN     06/26/17 1546    lidocaine (LIDODERM) 5 %  Every 24 hours     06/26/17 1546       Rise MuLeaphart, Kenneth T, PA-C 06/26/17 1730    Shaune PollackIsaacs, Cameron, MD 06/27/17 [phone]

## 2017-08-19 ENCOUNTER — Emergency Department (HOSPITAL_BASED_OUTPATIENT_CLINIC_OR_DEPARTMENT_OTHER)
Admission: EM | Admit: 2017-08-19 | Discharge: 2017-08-19 | Disposition: A | Discharge status: Home / Self Care | Payer: BLUE CROSS/BLUE SHIELD | Attending: Emergency Medicine | Admitting: Emergency Medicine

## 2017-08-19 ENCOUNTER — Encounter (HOSPITAL_BASED_OUTPATIENT_CLINIC_OR_DEPARTMENT_OTHER): Payer: Self-pay | Admitting: Emergency Medicine

## 2017-08-19 ENCOUNTER — Other Ambulatory Visit: Payer: Self-pay

## 2017-08-19 DIAGNOSIS — M25512 Pain in left shoulder: Secondary | ICD-10-CM | POA: Diagnosis not present

## 2017-08-19 DIAGNOSIS — M546 Pain in thoracic spine: Secondary | ICD-10-CM | POA: Diagnosis not present

## 2017-08-19 MED ORDER — NAPROXEN 375 MG PO TABS: 375.0000 mg | ORAL_TABLET | Freq: Two times a day (BID) | ORAL | 0 refills | Status: DC

## 2017-08-19 MED ORDER — LIDOCAINE 5 % EX PTCH: 1.0000 | MEDICATED_PATCH | CUTANEOUS | 0 refills | Status: DC

## 2017-08-19 MED ORDER — CYCLOBENZAPRINE HCL 10 MG PO TABS: 10.0000 mg | ORAL_TABLET | Freq: Three times a day (TID) | ORAL | 0 refills | Status: DC | PRN

## 2017-08-19 NOTE — ED Triage Notes (Addendum)
Pt having left shoulder pain in January.   Pt states had improved but has worsened last two days.  No known injury but lifts heavy objects at work.  Pain increases with raising her arm.

## 2017-08-19 NOTE — ED Provider Notes (Signed)
[facility] Provider Note: Lowella Dell, MD, FACEP  CSN: [id number] MRN: [id number] ARRIVAL: 08/19/17 at 0330 ROOM: MH10/MH10   CHIEF COMPLAINT  Shoulder Pain   HISTORY OF PRESENT ILLNESS  08/19/17 4:31 AM Emily Lowery is a 27 y.o. female with a history of left shoulder pain that first occurred in the middle of January.  She was treated with medications and improved.  She is here with 2 days of recurrence of the same pain.  The pain is located in the left parathoracic region of the back and is worse with palpation or movement of her upper back and less so with movement of her left arm.  She rates her pain as an 8 out of 10 at its worst.  It is sharp in nature.  She denies recent injury but does lift heavy objects at work.   Past Medical History:  Diagnosis Date  . Chlamydia   . Gonorrhea   . Headache     Past Surgical History:  Procedure Laterality Date  . TONSILLECTOMY    . WISDOM TOOTH EXTRACTION      Family History  Problem Relation Age of Onset  . Diabetes Mother   . Diabetes Sister   . Asthma Sister   . Diabetes Maternal Grandmother   . Hypertension Maternal Grandmother   . Heart disease Maternal Grandmother     Social History   Tobacco Use  . Smoking status: Former Smoker    Types: Cigarettes  . Smokeless tobacco: Never Used  . Tobacco comment: quit age20  Substance Use Topics  . Alcohol use: Yes    Comment: occ  . Drug use: No    Prior to Admission medications   Medication Sig Start Date End Date Taking? Authorizing Provider  cyclobenzaprine (FLEXERIL) 10 MG tablet Take 1 tablet (10 mg total) by mouth 2 (two) times daily as needed for muscle spasms. 06/26/17   Demetrios Loll T, PA-C  lidocaine (LIDODERM) 5 % Place 1 patch onto the skin daily. Remove & Discard patch within 12 hours or as directed by MD 06/26/17   Rise Mu, PA-C  naproxen (NAPROSYN) 375 MG tablet Take 1 tablet (375 mg total) by mouth 2 (two) times daily. 06/26/17    Rise Mu, PA-C    Allergies Patient has no known allergies.   REVIEW OF SYSTEMS  Negative except as noted here or in the History of Present Illness.   PHYSICAL EXAMINATION  Initial Vital Signs Blood pressure 112/73, pulse 88, temperature 97.6 F (36.4 C), temperature source Oral, resp. rate 18, height 5\' 7"  (1.702 m), weight 90.7 kg (200 lb), last menstrual period 07/27/2017, SpO2 100 %, unknown if currently breastfeeding.  Examination General: Well-developed, well-nourished female in no acute distress; appearance consistent with age of record HENT: normocephalic; atraumatic Eyes: pupils equal, round and reactive to light; extraocular muscles intact Neck: supple; rotation, flexion and extension of neck does not exacerbate pain, leaning of the head to the right exacerbates pain Heart: regular rate and rhythm Lungs: clear to auscultation bilaterally Abdomen: soft; nondistended; nontender; bowel sounds present Back: Left parathoracic soft tissue tenderness Extremities: No deformity; full range of motion, no significant change in patient's pain with movement of the left shoulder Neurologic: Awake, alert and oriented; motor function intact in all extremities and symmetric; no facial droop Skin: Warm and dry Psychiatric: Normal mood and affect   RESULTS  Summary of this visit's results, reviewed by myself:   EKG Interpretation  Date/Time:  Ventricular Rate:    PR Interval:    QRS Duration:   QT Interval:    QTC Calculation:   R Axis:     Text Interpretation:        Laboratory Studies: No results found for this or any previous visit (from the past 24 hour(s)). Imaging Studies: No results found.  ED COURSE  Nursing notes and initial vitals signs, including pulse oximetry, reviewed.  Vitals:   08/19/17 0335  BP: 112/73  Pulse: 88  Resp: 18  Temp: 97.6 F (36.4 C)  TempSrc: Oral  SpO2: 100%  Weight: 90.7 kg (200 lb)  Height: 5\' 7"  (1.702 m)    The patient's tenderness suggest musculoskeletal pain although this may represent a cervical radiculopathy given some exacerbation with certain movements of the neck.  Since the patient had relief with medications previously we will try another course and refer her to sports medicine should symptoms persist.  PROCEDURES    ED DIAGNOSES     ICD-10-CM   1. Acute pain of left shoulder M25.512        Ludivina Guymon, Jonny RuizJohn, MD 08/19/17 [phone]

## 2017-10-11 ENCOUNTER — Other Ambulatory Visit: Payer: Self-pay

## 2017-10-11 ENCOUNTER — Emergency Department (HOSPITAL_BASED_OUTPATIENT_CLINIC_OR_DEPARTMENT_OTHER)
Admission: EM | Admit: 2017-10-11 | Discharge: 2017-10-11 | Disposition: A | Discharge status: Home / Self Care | Payer: BLUE CROSS/BLUE SHIELD | Attending: Emergency Medicine | Admitting: Emergency Medicine

## 2017-10-11 ENCOUNTER — Emergency Department (HOSPITAL_BASED_OUTPATIENT_CLINIC_OR_DEPARTMENT_OTHER): Payer: BLUE CROSS/BLUE SHIELD

## 2017-10-11 DIAGNOSIS — Z87891 Personal history of nicotine dependence: Secondary | ICD-10-CM | POA: Insufficient documentation

## 2017-10-11 DIAGNOSIS — L02611 Cutaneous abscess of right foot: Secondary | ICD-10-CM | POA: Diagnosis not present

## 2017-10-11 DIAGNOSIS — M79671 Pain in right foot: Secondary | ICD-10-CM | POA: Diagnosis present

## 2017-10-11 DIAGNOSIS — Z79899 Other long term (current) drug therapy: Secondary | ICD-10-CM | POA: Insufficient documentation

## 2017-10-11 DIAGNOSIS — Z23 Encounter for immunization: Secondary | ICD-10-CM | POA: Insufficient documentation

## 2017-10-11 MED ORDER — LIDOCAINE-EPINEPHRINE (PF) 2 %-1:200000 IJ SOLN
10.0000 mL | Freq: Once | INTRAMUSCULAR | Status: DC
  Filled 2017-10-11: qty 10

## 2017-10-11 MED ORDER — TETANUS-DIPHTH-ACELL PERTUSSIS 5-2.5-18.5 LF-MCG/0.5 IM SUSP
0.5000 mL | Freq: Once | INTRAMUSCULAR | Status: AC
  Administered 2017-10-11: 0.5 mL via INTRAMUSCULAR
  Filled 2017-10-11: qty 0.5

## 2017-10-11 MED ORDER — SULFAMETHOXAZOLE-TRIMETHOPRIM 800-160 MG PO TABS: 1.0000 | ORAL_TABLET | Freq: Two times a day (BID) | ORAL | 0 refills | Status: AC

## 2017-10-11 MED ORDER — IBUPROFEN 600 MG PO TABS: 600.0000 mg | ORAL_TABLET | Freq: Three times a day (TID) | ORAL | 0 refills | Status: DC | PRN

## 2017-10-11 NOTE — ED Triage Notes (Signed)
Pt has area to bottom on right foot.  Noted small swollen are that appears like a possible FB.  Pt states it feels better to put pressure on it, but has severe pain when she releases the pressure.

## 2017-10-11 NOTE — ED Provider Notes (Signed)
[facility] EMERGENCY DEPARTMENT Provider Note   CSN: [id number] Arrival date & time: 10/11/17  1946     History   Chief Complaint Chief Complaint  Patient presents with  . Foot Pain    HPI Emily Lowery is a 27 y.o. female.  HPI  27 year old female presents with right foot pain.  It is plantar and lateral.  No obvious trauma or puncture wounds.  Pain seems to be worsening and she is having a hard time walking because it hurts so much to put her weight down and like to go. Has not taken anything for pain.  Past Medical History:  Diagnosis Date  . Chlamydia   . Gonorrhea   . Headache     There are no active problems to display for this patient.   Past Surgical History:  Procedure Laterality Date  . TONSILLECTOMY    . WISDOM TOOTH EXTRACTION       OB History    Gravida  1   Para      Term      Preterm      AB      Living        SAB      TAB      Ectopic      Multiple      Live Births               Home Medications    Prior to Admission medications   Medication Sig Start Date End Date Taking? Authorizing Provider  cyclobenzaprine (FLEXERIL) 10 MG tablet Take 1 tablet (10 mg total) by mouth 3 (three) times daily as needed for muscle spasms. 08/19/17   Molpus, John, MD  ibuprofen (ADVIL,MOTRIN) 600 MG tablet Take 1 tablet (600 mg total) by mouth every 8 (eight) hours as needed. 10/11/17   Pricilla Loveless, MD  lidocaine (LIDODERM) 5 % Place 1 patch onto the skin daily. Remove & Discard patch within 12 hours or as directed by MD 08/19/17   Molpus, Jonny Ruiz, MD  naproxen (NAPROSYN) 375 MG tablet Take 1 tablet (375 mg total) by mouth 2 (two) times daily. 08/19/17   Molpus, John, MD  sulfamethoxazole-trimethoprim (BACTRIM DS,SEPTRA DS) 800-160 MG tablet Take 1 tablet by mouth 2 (two) times daily for 5 days. 10/11/17 10/16/17  Pricilla Loveless, MD    Family History Family History  Problem Relation Age of Onset  . Diabetes Mother   . Diabetes Sister   .  Asthma Sister   . Diabetes Maternal Grandmother   . Hypertension Maternal Grandmother   . Heart disease Maternal Grandmother     Social History Social History   Tobacco Use  . Smoking status: Former Smoker    Types: Cigarettes  . Smokeless tobacco: Never Used  . Tobacco comment: quit age20  Substance Use Topics  . Alcohol use: Yes    Comment: occ  . Drug use: No     Allergies   Patient has no known allergies.   Review of Systems Review of Systems  Musculoskeletal: Positive for myalgias.  Skin: Negative for wound.  Neurological: Negative for weakness and numbness.     Physical Exam Updated Vital Signs BP 108/64 (BP Location: Left Arm)   Pulse 84   Temp 98.2 F (36.8 C) (Oral)   Resp 18   Ht  (1.702 m)   Wt 90.7 kg (200 lb)   LMP 10/04/2017   SpO2 100%   BMI 31.32 kg/m   Physical Exam  Constitutional: She appears well-developed and well-nourished.  HENT:  Head: Normocephalic and atraumatic.  Eyes: Right eye exhibits no discharge. Left eye exhibits no discharge.  Cardiovascular: Normal rate and regular rhythm.  Pulses:      Dorsalis pedis pulses are 2+ on the right side.  Pulmonary/Chest: Effort normal.  Musculoskeletal:       Right foot: There is tenderness.       Feet:  Neurological: She is alert.  Skin: Skin is warm and dry. No erythema.  Nursing note and vitals reviewed.    ED Treatments / Results  Labs (all labs ordered are listed, but only abnormal results are displayed) Labs Reviewed - No data to display  EKG None  Radiology Dg Foot Complete Right  Result Date: 10/11/2017 CLINICAL DATA:  Plantar foot pain.  Question puncture injury. EXAM: RIGHT FOOT COMPLETE - 3+ VIEW COMPARISON:  None. FINDINGS: There is no evidence of fracture or dislocation. There is no evidence of arthropathy or other focal bone abnormality. Soft tissues are unremarkable. IMPRESSION: Negative. Specifically, no evidence for retained radiopaque soft tissue foreign  body. Electronically Signed   By: Kennith Center M.D.   On: 10/11/2017 20:43    Procedures .Marland KitchenIncision and Drainage Date/Time: 10/11/2017 11:25 PM Performed by: Pricilla Loveless, MD Authorized by: Pricilla Loveless, MD   Consent:    Consent obtained:  Verbal   Consent given by:  Patient Location:    Type:  Abscess   Size:  <1 cm   Location:  Lower extremity   Lower extremity location:  Foot   Foot location:  R foot Anesthesia (see MAR for exact dosages):    Anesthesia method:  Local infiltration   Local anesthetic:  Lidocaine 2% WITH epi Procedure type:    Complexity:  Simple Procedure details:    Needle aspiration: yes     Needle size:  18 G   Incision types:  Single straight   Incision depth:  Dermal   Scalpel blade:  11   Drainage amount: none.   Wound treatment:  Wound left open   Packing materials:  None Post-procedure details:    Patient tolerance of procedure:  Tolerated with difficulty   (including critical care time)  Medications Ordered in ED Medications  lidocaine-EPINEPHrine (XYLOCAINE W/EPI) 2 %-1:200000 (PF) injection 10 mL (has no administration in time range)  Tdap (BOOSTRIX) injection 0.5 mL (0.5 mLs Intramuscular Given 10/11/17 2106)     Initial Impression / Assessment and Plan / ED Course  I have reviewed the triage vital signs and the nursing notes.  Pertinent labs & imaging results that were available during my care of the patient were reviewed by me and considered in my medical decision making (see chart for details).     Bedside ultrasound seems to show a small fluid collection at the surface where the raised, painful area is.  Attempted incision and drainage and needle aspiration but no fluid returns.  Unclear if this is a small abscess/cyst or possibly a wart.  I will place her on Bactrim given it could be abscess and have her do warm soaks.  NSAIDs for pain.  Follow-up with PCP, discussed return precautions.  Final Clinical Impressions(s) / ED  Diagnoses   Final diagnoses:  Acute foot pain, right    ED Discharge Orders        Ordered    sulfamethoxazole-trimethoprim (BACTRIM DS,SEPTRA DS) 800-160 MG tablet  2 times daily     10/11/17 2305    ibuprofen (ADVIL,MOTRIN) 600  MG tablet  Every 8 hours PRN     10/11/17 2305       Pricilla Loveless, MD 10/11/17 2327

## 2018-09-02 ENCOUNTER — Other Ambulatory Visit: Payer: Self-pay

## 2018-09-02 ENCOUNTER — Encounter (HOSPITAL_BASED_OUTPATIENT_CLINIC_OR_DEPARTMENT_OTHER): Payer: Self-pay | Admitting: Emergency Medicine

## 2018-09-02 ENCOUNTER — Emergency Department (HOSPITAL_BASED_OUTPATIENT_CLINIC_OR_DEPARTMENT_OTHER)
Admission: EM | Admit: 2018-09-02 | Discharge: 2018-09-02 | Disposition: A | Discharge status: Home / Self Care | Payer: BLUE CROSS/BLUE SHIELD | Attending: Emergency Medicine | Admitting: Emergency Medicine

## 2018-09-02 DIAGNOSIS — H9201 Otalgia, right ear: Secondary | ICD-10-CM | POA: Diagnosis present

## 2018-09-02 DIAGNOSIS — Z87891 Personal history of nicotine dependence: Secondary | ICD-10-CM | POA: Diagnosis not present

## 2018-09-02 DIAGNOSIS — Z791 Long term (current) use of non-steroidal anti-inflammatories (NSAID): Secondary | ICD-10-CM | POA: Diagnosis not present

## 2018-09-02 DIAGNOSIS — M26621 Arthralgia of right temporomandibular joint: Secondary | ICD-10-CM

## 2018-09-02 LAB — PREGNANCY, URINE: Preg Test, Ur: NEGATIVE

## 2018-09-02 MED ORDER — NAPROXEN 250 MG PO TABS
500.0000 mg | ORAL_TABLET | Freq: Once | ORAL | Status: AC
  Administered 2018-09-02: 500 mg via ORAL
  Filled 2018-09-02: qty 2

## 2018-09-02 MED ORDER — DIAZEPAM 10 MG PO TABS: ORAL_TABLET | ORAL | 0 refills | Status: DC

## 2018-09-02 MED ORDER — NAPROXEN 375 MG PO TABS: ORAL_TABLET | ORAL | 0 refills | Status: DC

## 2018-09-02 NOTE — ED Provider Notes (Signed)
[facility] Provider Note: Lowella Dell, MD, FACEP  CSN: [id number] MRN: [id number] ARRIVAL: 09/02/18 at 0523 ROOM: MH02/MH02   CHIEF COMPLAINT  Ear Pain   HISTORY OF PRESENT ILLNESS  09/02/18 5:40 AM Emily Lowery is a 28 y.o. female with a 5-day history of pain in her right ear.  She feels the pain all around the right ear.  She denies recent cold symptoms.  She denies swimming.  The pain is rated as a 10 out of 10 and is worse with palpation around the right ear or movement of the right side of the jaw.  She has not been taking anything for the pain except using over-the-counter belladonna drops.    Past Medical History:  Diagnosis Date  . Chlamydia   . Gonorrhea   . Headache     Past Surgical History:  Procedure Laterality Date  . TONSILLECTOMY    . WISDOM TOOTH EXTRACTION      Family History  Problem Relation Age of Onset  . Diabetes Mother   . Diabetes Sister   . Asthma Sister   . Diabetes Maternal Grandmother   . Hypertension Maternal Grandmother   . Heart disease Maternal Grandmother     Social History   Tobacco Use  . Smoking status: Former Smoker    Types: Cigarettes  . Smokeless tobacco: Never Used  . Tobacco comment: quit age20  Substance Use Topics  . Alcohol use: Yes    Comment: occ  . Drug use: No    Prior to Admission medications   Medication Sig Start Date End Date Taking? Authorizing Provider  cyclobenzaprine (FLEXERIL) 10 MG tablet Take 1 tablet (10 mg total) by mouth 3 (three) times daily as needed for muscle spasms. 08/19/17   Zelig Gacek, MD  ibuprofen (ADVIL,MOTRIN) 600 MG tablet Take 1 tablet (600 mg total) by mouth every 8 (eight) hours as needed. 10/11/17   Pricilla Loveless, MD  lidocaine (LIDODERM) 5 % Place 1 patch onto the skin daily. Remove & Discard patch within 12 hours or as directed by MD 08/19/17   Breely Panik, Jonny Ruiz, MD  naproxen (NAPROSYN) 375 MG tablet Take 1 tablet (375 mg total) by mouth 2 (two) times daily. 08/19/17    Kaleigh Spiegelman, Jonny Ruiz, MD    Allergies Patient has no known allergies.   REVIEW OF SYSTEMS  Negative except as noted here or in the History of Present Illness.   PHYSICAL EXAMINATION  Initial Vital Signs Blood pressure 120/78, pulse 88, temperature 98 F (36.7 C), temperature source Oral, resp. rate 18, height 5\' 7"  (1.702 m), weight 90.7 kg, last menstrual period 07/14/2018, SpO2 100 %, unknown if currently breastfeeding.  Examination General: Well-developed, well-nourished female in no acute distress; appearance consistent with age of record HENT: normocephalic; atraumatic; TMs normal; tenderness of right TMJ with pain on movement of right jaw Eyes: Normal appearance Neck: supple Heart: regular rate and rhythm Lungs: clear to auscultation bilaterally Abdomen: soft; nondistended; nontender; bowel sounds present Extremities: No deformity; full range of motion Neurologic: Awake, alert and oriented; motor function intact in all extremities and symmetric; no facial droop Skin: Warm and dry Psychiatric: Normal mood and affect   RESULTS  Summary of this visit's results, reviewed by myself:   EKG Interpretation  Date/Time:    Ventricular Rate:    PR Interval:    QRS Duration:   QT Interval:    QTC Calculation:   R Axis:     Text Interpretation:  Laboratory Studies: Results for orders placed or performed during the hospital encounter of 09/02/18 (from the past 24 hour(s))  Pregnancy, urine     Status: None   Collection Time: 09/02/18  5:59 AM  Result Value Ref Range   Preg Test, Ur NEGATIVE NEGATIVE   Imaging Studies: No results found.  ED COURSE and MDM  Nursing notes and initial vitals signs, including pulse oximetry, reviewed.  Vitals:   09/02/18 0533  BP: 120/78  Pulse: 88  Resp: 18  Temp: 98 F (36.7 C)  TempSrc: Oral  SpO2: 100%  Weight: 90.7 kg  Height: 5\' 7"  (1.702 m)   Examination consistent with TMJ syndrome.  Patient was advised that this is  ultimately a dental problem and she will need to find a dentist for follow-up.  In the meantime we will treat her with a muscle relaxant and NSAID.  PROCEDURES    ED DIAGNOSES     ICD-10-CM   1. Arthralgia of right temporomandibular joint M26.621        Paula Libra, MD 09/02/18 [phone]

## 2018-09-02 NOTE — ED Notes (Signed)
ED Provider at bedside. 

## 2018-09-02 NOTE — ED Triage Notes (Signed)
Ear pain x4-5 days, using over the counter belladonna gtts with no relief. Denies fevers.

## 2018-10-19 ENCOUNTER — Emergency Department (HOSPITAL_BASED_OUTPATIENT_CLINIC_OR_DEPARTMENT_OTHER)
Admission: EM | Admit: 2018-10-19 | Discharge: 2018-10-19 | Disposition: A | Discharge status: Home / Self Care | Payer: BLUE CROSS/BLUE SHIELD | Attending: Emergency Medicine | Admitting: Emergency Medicine

## 2018-10-19 ENCOUNTER — Encounter (HOSPITAL_BASED_OUTPATIENT_CLINIC_OR_DEPARTMENT_OTHER): Payer: Self-pay | Admitting: Emergency Medicine

## 2018-10-19 ENCOUNTER — Other Ambulatory Visit: Payer: Self-pay

## 2018-10-19 DIAGNOSIS — Z79899 Other long term (current) drug therapy: Secondary | ICD-10-CM | POA: Insufficient documentation

## 2018-10-19 DIAGNOSIS — M26622 Arthralgia of left temporomandibular joint: Secondary | ICD-10-CM | POA: Diagnosis present

## 2018-10-19 DIAGNOSIS — Z87891 Personal history of nicotine dependence: Secondary | ICD-10-CM | POA: Diagnosis not present

## 2018-10-19 LAB — PREGNANCY, URINE: Preg Test, Ur: NEGATIVE

## 2018-10-19 MED ORDER — DIAZEPAM 10 MG PO TABS: ORAL_TABLET | ORAL | 1 refills | Status: DC

## 2018-10-19 MED ORDER — NAPROXEN 375 MG PO TABS: ORAL_TABLET | ORAL | 1 refills | Status: DC

## 2018-10-19 NOTE — ED Triage Notes (Signed)
Pt reports L sided ear/dental pain, reports she was taking naproxen and a muscle relaxer. Seen by Dr. Read Drivers for same in our ED. Unable to follow up due to COVID 19. Wants med refills

## 2018-10-19 NOTE — ED Provider Notes (Addendum)
[facility] Provider Note: Lowella DellJ. Lane Adriane Gabbert, MD, FACEP  CSN: [id number] MRN: [id number] ARRIVAL: 10/19/18 at 0511 ROOM: MH05/[facility]05   CHIEF COMPLAINT  Temporomandibular Joint Pain   HISTORY OF PRESENT ILLNESS  10/19/18 5:26 AM Emily Lowery is a 28 y.o. female who was treated by myself for temporomandibular joint pain on March 22 of this year.  Her pain was on the right side at that time.  She was treated with naproxen and diazepam.  She was referred to dentistry but has not been able to get an appointment due to COVID-19. She returns with left-sided pain of a similar nature that began 5 days ago.  She ran out of diazepam at that time and has been using naproxen.  She took her last naproxen earlier this morning after the pain awakened her from sleep.  Prior to taking the naproxen her pain was a 10 out of 10.  It is now 0 out of 10 at rest.  Pain is worse with movement or palpation of her left TMJ region.   Past Medical History:  Diagnosis Date  . Chlamydia   . Gonorrhea   . Headache     Past Surgical History:  Procedure Laterality Date  . TONSILLECTOMY    . WISDOM TOOTH EXTRACTION      Family History  Problem Relation Age of Onset  . Diabetes Mother   . Diabetes Sister   . Asthma Sister   . Diabetes Maternal Grandmother   . Hypertension Maternal Grandmother   . Heart disease Maternal Grandmother     Social History   Tobacco Use  . Smoking status: Former Smoker    Types: Cigarettes  . Smokeless tobacco: Never Used  . Tobacco comment: quit age20  Substance Use Topics  . Alcohol use: Yes    Comment: occ  . Drug use: No    Prior to Admission medications   Medication Sig Start Date End Date Taking? Authorizing Provider  naproxen (NAPROSYN) 375 MG tablet Take 1 tablet twice daily as needed for jaw pain. 09/02/18  Yes Lovie Zarling, MD  diazepam (VALIUM) 10 MG tablet Take 1/2 to 1 tablet at bedtime as needed for jaw spasms. 09/02/18   Lilana Blasko, Jonny RuizJohn, MD     Allergies Patient has no known allergies.   REVIEW OF SYSTEMS  Negative except as noted here or in the History of Present Illness.   PHYSICAL EXAMINATION  Initial Vital Signs Blood pressure 118/73, pulse 77, temperature 97.8 F (36.6 C), temperature source Oral, resp. rate 18, height 5\' 7"  (1.702 m), weight 93.9 kg, last menstrual period 09/20/2018, SpO2 100 %, unknown if currently breastfeeding.  Examination General: Well-developed, well-nourished female in no acute distress; appearance consistent with age of record HENT: normocephalic; atraumatic; left TMJ tenderness; TMs normal Eyes: pupils equal, round and reactive to light; extraocular muscles intact Neck: supple Heart: regular rate and rhythm Lungs: clear to auscultation bilaterally Abdomen: soft; nondistended; nontender; bowel sounds present Extremities: No deformity; full range of motion Neurologic: Awake, alert and oriented; motor function intact in all extremities and symmetric; no facial droop Skin: Warm and dry Psychiatric: Normal mood and affect   RESULTS  Summary of this visit's results, reviewed by myself:   EKG Interpretation  Date/Time:    Ventricular Rate:    PR Interval:    QRS Duration:   QT Interval:    QTC Calculation:   R Axis:     Text Interpretation:        Laboratory Studies:  Results for orders placed or performed during the hospital encounter of 10/19/18 (from the past 24 hour(s))  Pregnancy, urine     Status: None   Collection Time: 10/19/18  5:25 AM  Result Value Ref Range   Preg Test, Ur NEGATIVE NEGATIVE   Imaging Studies: No results found.  ED COURSE and MDM  Nursing notes and initial vitals signs, including pulse oximetry, reviewed.  Vitals:   10/19/18 0520 10/19/18 0521  BP: 118/73   Pulse: 77   Resp: 18   Temp: 97.8 F (36.6 C)   TempSrc: Oral   SpO2: 100%   Weight:  93.9 kg  Height:  5\' 7"  (1.702 m)   We will refill the patient's prescriptions.  She was advised  to follow-up with dentistry as soon as she is able to get an appointment.  PROCEDURES    ED DIAGNOSES     ICD-10-CM   1. Arthralgia of left temporomandibular joint M26.622        Pallavi Clifton, Jonny Ruiz, MD 10/19/18 0537    Paula Libra, MD 10/19/18 [phone]

## 2018-11-09 ENCOUNTER — Other Ambulatory Visit: Payer: Self-pay

## 2018-11-09 ENCOUNTER — Ambulatory Visit (INDEPENDENT_AMBULATORY_CARE_PROVIDER_SITE_OTHER): Payer: BLUE CROSS/BLUE SHIELD | Admitting: Medical

## 2018-11-09 ENCOUNTER — Encounter: Payer: Self-pay | Admitting: Medical

## 2018-11-09 VITALS — Ht 67.0 in | Wt 206.0 lb

## 2018-11-09 DIAGNOSIS — H6093 Unspecified otitis externa, bilateral: Secondary | ICD-10-CM | POA: Diagnosis not present

## 2018-11-09 DIAGNOSIS — K0889 Other specified disorders of teeth and supporting structures: Secondary | ICD-10-CM

## 2018-11-09 DIAGNOSIS — M26623 Arthralgia of bilateral temporomandibular joint: Secondary | ICD-10-CM

## 2018-11-09 DIAGNOSIS — H9203 Otalgia, bilateral: Secondary | ICD-10-CM | POA: Diagnosis not present

## 2018-11-09 MED ORDER — CYCLOBENZAPRINE HCL 5 MG PO TABS: 5.0000 mg | ORAL_TABLET | Freq: Every day | ORAL | 0 refills | Status: DC

## 2018-11-09 MED ORDER — NEOMYCIN-POLYMYXIN-HC 3.5-10000-1 OT SOLN: 3.0000 [drp] | Freq: Three times a day (TID) | OTIC | 0 refills | Status: DC

## 2018-11-09 NOTE — Progress Notes (Signed)
Subjective:    Patient ID: Emily Lowery, female    DOB: 10/27/1990, 28 y.o.   MRN: [id number]  HPI  Virtual Visit via Video Note  I connected with Emily PelYakima Brasil on 11/09/18 at  2:40 PM EDT by a video enabled telemedicine application and verified that I am speaking with the correct person using two identifiers.  Location: Patient: home Provider: home  Does not have bp cuff at home.  Lmp- presently.   I discussed the limitations of evaluation and management by telemedicine and the availability of in person appointments. The patient expressed understanding and agreed to proceed.  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 Non smoker. Stopped smoking 11 years ago. Rare alcohol.  Pt had 2 weeks of some tmj area pain. With this she also reports some ear pain. Pt went to ED this month and they gave her naproxen and valium which she says helped a lot.  No prior tmj pain this above.  Occasional teeth sensitivity to cold and sweets.(last time saw dentist was 28 yo.  However she does report some transient ear pain inside ears with loud sounds. Sharp pain at times with loud sounds. No fever, no chills or sweats. When presses on tragus some deep pain and some pain that runs down cheek.     Observations/Objective: General-no acute distress, pleasant, oriented. Lungs- on inspection lungs appear unlabored. Neck- no tracheal deviation or jvd on inspection. Neuro- gross motor function appears intact. heent- some tragal tenderness both side. On both side some faint pain runs down mandible.  Assessment and Plan: For tmj syndrome/arthralgia recommend low doe ibuprofen if you get recurrent flare. Also can use low dose flexeril at night if flares again . Appear to have responded well to naproxen and valium. Educated on conservative measures to avoid tmj.  Tragal tenderness bilaterally so will treat for otitis externa  with cortisporin otic. If pain deep in ears and constant then use keflex. Making available.  For occasional tooth pain with cold food and sugar, recommend see dentist. If constant pain start keflex.  Follow up in 10 days or as needed  Follow Up Instructions:    I discussed the assessment and treatment plan with the patient. The patient was provided an opportunity to ask questions and all were answered. The patient agreed with the plan and demonstrated an understanding of the instructions.   The patient was advised to call back or seek an in-person evaluation if the symptoms worsen or if the condition fails to improve as anticipated.  I provided 30 minutes of non-face-to-face time during this encounter.(new pt)   Esperanza RichtersEdward Jozef Eisenbeis, PA-C    Review of Systems  Constitutional: Negative for chills, diaphoresis and fatigue.  Respiratory: Negative for cough, chest tightness, shortness of breath and wheezing.   Cardiovascular: Negative for chest pain and palpitations.  Gastrointestinal: Negative for abdominal pain.  Skin: Negative for rash.  Neurological: Negative for dizziness, numbness and headaches.  Hematological: Negative for adenopathy. Does not bruise/bleed easily.  Psychiatric/Behavioral: Negative for behavioral problems and confusion.   Past Medical History:  Diagnosis Date  . Allergy   . Chlamydia   . Gonorrhea   . Headache      Social History   Socioeconomic History  . Marital status: Single    Spouse name: Not on file  . Number of children: Not on file  . Years of education: Not on file  . Highest  education level: Not on file  Occupational History  . Not on file  Social Needs  . Financial resource strain: Not on file  . Food insecurity:    Worry: Not on file    Inability: Not on file  . Transportation needs:    Medical: Not on file    Non-medical: Not on file  Tobacco Use  . Smoking status: Former Smoker    Years: 11.00    Types: Cigarettes  . Smokeless  tobacco: Never Used  . Tobacco comment: quit age20  Substance and Sexual Activity  . Alcohol use: Yes    Comment: occ/rare  . Drug use: No  . Sexual activity: Yes    Birth control/protection: None  Lifestyle  . Physical activity:    Days per week: Not on file    Minutes per session: Not on file  . Stress: Not on file  Relationships  . Social connections:    Talks on phone: Not on file    Gets together: Not on file    Attends religious service: Not on file    Active member of club or organization: Not on file    Attends meetings of clubs or organizations: Not on file    Relationship status: Not on file  . Intimate partner violence:    Fear of current or ex partner: Not on file    Emotionally abused: Not on file    Physically abused: Not on file    Forced sexual activity: Not on file  Other Topics Concern  . Not on file  Social History Narrative  . Not on file    Past Surgical History:  Procedure Laterality Date  . TONSILLECTOMY    . TONSILLECTOMY    . WISDOM TOOTH EXTRACTION      Family History  Problem Relation Age of Onset  . Diabetes Mother   . Diabetes Sister   . Asthma Sister   . Diabetes Maternal Grandmother   . Hypertension Maternal Grandmother   . Heart disease Maternal Grandmother     No Known Allergies  Current Outpatient Medications on File Prior to Visit  Medication Sig Dispense Refill  . diazepam (VALIUM) 10 MG tablet Take 1/2 to 1 tablet at bedtime as needed for jaw spasms. 10 tablet 1  . naproxen (NAPROSYN) 375 MG tablet Take 1 tablet twice daily as needed for jaw pain. 20 tablet 1   No current facility-administered medications on file prior to visit.     Ht 5\' 7"  (1.702 m)   Wt 206 lb (93.4 kg)   BMI 32.26 kg/m      Objective:   Physical Exam        Assessment & Plan:

## 2018-11-09 NOTE — Patient Instructions (Addendum)
For tmj syndrome/arthralgia recommend low doe ibuprofen if you get recurrent flare. Also can use low dose flexeril at night if flares again . Appear to have responded well to naproxen and valium. Educated on conservative measures to avoid tmj.  Tragal tenderness bilaterally so will treat for otitis externa with cortisporin otic. If pain deep in ears and constant then use keflex. Making available.  For occasional tooth pain with cold food and sugar, recommend see dentist. If constant pain start keflex.  Follow up in 10 days or as needed

## 2019-04-23 ENCOUNTER — Other Ambulatory Visit: Payer: Self-pay

## 2019-04-23 DIAGNOSIS — Z20822 Contact with and (suspected) exposure to covid-19: Secondary | ICD-10-CM

## 2019-04-24 LAB — NOVEL CORONAVIRUS, NAA: SARS-CoV-2, NAA: NOT DETECTED

## 2019-08-02 ENCOUNTER — Other Ambulatory Visit: Payer: Self-pay | Admitting: Obstetrics and Gynecology

## 2019-08-02 DIAGNOSIS — N926 Irregular menstruation, unspecified: Secondary | ICD-10-CM

## 2019-08-05 ENCOUNTER — Other Ambulatory Visit: Payer: Self-pay

## 2019-08-05 ENCOUNTER — Ambulatory Visit
Admission: RE | Admit: 2019-08-05 | Discharge: 2019-08-05 | Disposition: A | Discharge status: Home / Self Care | Payer: BLUE CROSS/BLUE SHIELD | Source: Ambulatory Visit | Attending: Obstetrics and Gynecology | Admitting: Obstetrics and Gynecology

## 2019-08-05 DIAGNOSIS — N926 Irregular menstruation, unspecified: Secondary | ICD-10-CM

## 2019-08-06 DIAGNOSIS — M26609 Unspecified temporomandibular joint disorder, unspecified side: Secondary | ICD-10-CM | POA: Insufficient documentation

## 2020-08-03 ENCOUNTER — Emergency Department (HOSPITAL_BASED_OUTPATIENT_CLINIC_OR_DEPARTMENT_OTHER): Payer: Self-pay

## 2020-08-03 ENCOUNTER — Other Ambulatory Visit: Payer: Self-pay

## 2020-08-03 ENCOUNTER — Emergency Department (HOSPITAL_BASED_OUTPATIENT_CLINIC_OR_DEPARTMENT_OTHER)
Admission: EM | Admit: 2020-08-03 | Discharge: 2020-08-03 | Disposition: A | Discharge status: Home / Self Care | Payer: Self-pay | Attending: Emergency Medicine | Admitting: Emergency Medicine

## 2020-08-03 ENCOUNTER — Encounter (HOSPITAL_BASED_OUTPATIENT_CLINIC_OR_DEPARTMENT_OTHER): Payer: Self-pay | Admitting: Emergency Medicine

## 2020-08-03 DIAGNOSIS — R110 Nausea: Secondary | ICD-10-CM | POA: Insufficient documentation

## 2020-08-03 DIAGNOSIS — Z87891 Personal history of nicotine dependence: Secondary | ICD-10-CM | POA: Insufficient documentation

## 2020-08-03 DIAGNOSIS — R109 Unspecified abdominal pain: Secondary | ICD-10-CM

## 2020-08-03 DIAGNOSIS — R1031 Right lower quadrant pain: Secondary | ICD-10-CM | POA: Insufficient documentation

## 2020-08-03 DIAGNOSIS — R1032 Left lower quadrant pain: Secondary | ICD-10-CM | POA: Insufficient documentation

## 2020-08-03 LAB — URINALYSIS, ROUTINE W REFLEX MICROSCOPIC
Bilirubin Urine: NEGATIVE
Glucose, UA: NEGATIVE mg/dL
Hgb urine dipstick: NEGATIVE
Ketones, ur: NEGATIVE mg/dL
Leukocytes,Ua: NEGATIVE
Nitrite: NEGATIVE
Protein, ur: NEGATIVE mg/dL
Specific Gravity, Urine: 1.025 (ref 1.005–1.030)
pH: 6 (ref 5.0–8.0)

## 2020-08-03 LAB — CBC WITH DIFFERENTIAL/PLATELET
Abs Immature Granulocytes: 0.01 10*3/uL (ref 0.00–0.07)
Basophils Absolute: 0 10*3/uL (ref 0.0–0.1)
Basophils Relative: 1 %
Eosinophils Absolute: 0.1 10*3/uL (ref 0.0–0.5)
Eosinophils Relative: 1 %
HCT: 41.7 % (ref 36.0–46.0)
Hemoglobin: 13.3 g/dL (ref 12.0–15.0)
Immature Granulocytes: 0 %
Lymphocytes Relative: 42 %
Lymphs Abs: 2.8 10*3/uL (ref 0.7–4.0)
MCH: 27.5 pg (ref 26.0–34.0)
MCHC: 31.9 g/dL (ref 30.0–36.0)
MCV: 86.3 fL (ref 80.0–100.0)
Monocytes Absolute: 0.5 10*3/uL (ref 0.1–1.0)
Monocytes Relative: 7 %
Neutro Abs: 3.3 10*3/uL (ref 1.7–7.7)
Neutrophils Relative %: 49 %
Platelets: 377 10*3/uL (ref 150–400)
RBC: 4.83 MIL/uL (ref 3.87–5.11)
RDW: 14.1 % (ref 11.5–15.5)
WBC: 6.7 10*3/uL (ref 4.0–10.5)
nRBC: 0 % (ref 0.0–0.2)

## 2020-08-03 LAB — PREGNANCY, URINE: Preg Test, Ur: NEGATIVE

## 2020-08-03 LAB — BASIC METABOLIC PANEL
Anion gap: 9 (ref 5–15)
BUN: 15 mg/dL (ref 6–20)
CO2: 21 mmol/L — ABNORMAL LOW (ref 22–32)
Calcium: 9.4 mg/dL (ref 8.9–10.3)
Chloride: 104 mmol/L (ref 98–111)
Creatinine, Ser: 0.66 mg/dL (ref 0.44–1.00)
GFR, Estimated: >60 mL/min (ref >60)
Glucose, Bld: 123 mg/dL — ABNORMAL HIGH (ref 70–99)
Potassium: 4.3 mmol/L (ref 3.5–5.1)
Sodium: 134 mmol/L — ABNORMAL LOW (ref 135–145)

## 2020-08-03 MED ORDER — KETOROLAC TROMETHAMINE 60 MG/2ML IM SOLN
60.0000 mg | Freq: Once | INTRAMUSCULAR | Status: AC
  Administered 2020-08-03: 60 mg via INTRAMUSCULAR
  Filled 2020-08-03: qty 2

## 2020-08-03 NOTE — ED Notes (Signed)
EDP at bedside  

## 2020-08-03 NOTE — ED Triage Notes (Signed)
Pt reports RLQ pain that radiates to back bilaterally that started last night; pt reports nausea for the last week; pt denies vomiting or diarrhea; denies dysuria; pt ambulatory; pt denies injury

## 2020-08-03 NOTE — ED Provider Notes (Signed)
[facility] EMERGENCY DEPARTMENT Provider Note   CSN: [id number] Arrival date & time: 08/03/20  0404     History Chief Complaint  Patient presents with  . Abdominal Pain    Emily Lowery is a 30 y.o. female.  The history is provided by the patient.  Abdominal Pain Pain location:  RLQ, LLQ and suprapubic Pain quality: cramping and sharp   Pain radiates to:  Does not radiate Pain severity:  Severe Onset quality:  Sudden Duration:  1 day Timing:  Constant Progression:  Unchanged Chronicity:  New Context: not sick contacts and not trauma   Relieved by:  Nothing Worsened by:  Nothing Ineffective treatments:  None tried Associated symptoms: nausea   Associated symptoms: no anorexia, no chest pain, no constipation, no diarrhea, no dysuria, no fever, no hematuria, no shortness of breath, no vaginal bleeding, no vaginal discharge and no vomiting   Risk factors: has not had multiple surgeries and not pregnant   Patient with irregular menses presents with Lower abdominal abdominal pain that radiates to B flanks for 24 hours.  Has had a a week of nausea but no emesis.  No urinary complaints, no diarrhea, no constipation.  She has not taken anything for the pain.       Past Medical History:  Diagnosis Date  . Allergy   . Chlamydia   . Gonorrhea   . Headache     There are no problems to display for this patient.   Past Surgical History:  Procedure Laterality Date  . TONSILLECTOMY    . TONSILLECTOMY    . WISDOM TOOTH EXTRACTION       OB History    Gravida  1   Para      Term      Preterm      AB      Living        SAB      IAB      Ectopic      Multiple      Live Births              Family History  Problem Relation Age of Onset  . Diabetes Mother   . Diabetes Sister   . Asthma Sister   . Diabetes Maternal Grandmother   . Hypertension Maternal Grandmother   . Heart disease Maternal Grandmother     Social History   Tobacco Use   . Smoking status: Former Smoker    Years: 11.00    Types: Cigarettes  . Smokeless tobacco: Never Used  . Tobacco comment: quit age20  Substance Use Topics  . Alcohol use: Yes    Comment: occ/rare  . Drug use: No    Home Medications Prior to Admission medications   Medication Sig Start Date End Date Taking? Authorizing Provider  cyclobenzaprine (FLEXERIL) 5 MG tablet Take 1 tablet (5 mg total) by mouth at bedtime. 11/09/18   Saguier, Ramon DredgeEdward, PA-C  diazepam (VALIUM) 10 MG tablet Take 1/2 to 1 tablet at bedtime as needed for jaw spasms. 10/19/18   Molpus, John, MD  naproxen (NAPROSYN) 375 MG tablet Take 1 tablet twice daily as needed for jaw pain. 10/19/18   Molpus, John, MD  neomycin-polymyxin-hydrocortisone (CORTISPORIN) OTIC solution Place 3 drops into both ears 3 (three) times daily. 11/09/18   Saguier, Ramon DredgeEdward, PA-C    Allergies    Patient has no known allergies.  Review of Systems   Review of Systems  Constitutional: Negative for fever.  HENT:  Negative for congestion.   Eyes: Negative for visual disturbance.  Respiratory: Negative for shortness of breath.   Cardiovascular: Negative for chest pain.  Gastrointestinal: Positive for abdominal pain and nausea. Negative for anorexia, constipation, diarrhea and vomiting.  Genitourinary: Positive for flank pain. Negative for dysuria, hematuria, vaginal bleeding and vaginal discharge.  Skin: Negative for rash.  Neurological: Negative for dizziness.  Psychiatric/Behavioral: Negative for agitation.  All other systems reviewed and are negative.   Physical Exam Updated Vital Signs BP 110/81 (BP Location: Right Arm)   Pulse 81   Temp 98.8 F (37.1 C)   Resp 20   Ht 5\' 7"  (1.702 m)   Wt 96.1 kg   SpO2 98%   BMI 33.17 kg/m   Physical Exam Vitals and nursing note reviewed.  Constitutional:      Appearance: Normal appearance. She is not diaphoretic.     Comments: Patient appears very comfortably, there are no non verbal signs of  pain   HENT:     Head: Normocephalic and atraumatic.     Nose: Nose normal.  Eyes:     Conjunctiva/sclera: Conjunctivae normal.     Pupils: Pupils are equal, round, and reactive to light.  Cardiovascular:     Rate and Rhythm: Normal rate and regular rhythm.     Pulses: Normal pulses.     Heart sounds: Normal heart sounds.  Pulmonary:     Effort: Pulmonary effort is normal.     Breath sounds: Normal breath sounds.  Abdominal:     General: Abdomen is flat. Bowel sounds are normal.     Palpations: Abdomen is soft.     Tenderness: There is no abdominal tenderness. There is no guarding or rebound.     Hernia: No hernia is present.  Musculoskeletal:        General: Normal range of motion.     Cervical back: Normal range of motion and neck supple.  Skin:    General: Skin is warm and dry.     Capillary Refill: Capillary refill takes less than 2 seconds.  Neurological:     General: No focal deficit present.     Mental Status: She is alert and oriented to person, place, and time.     Deep Tendon Reflexes: Reflexes normal.  Psychiatric:        Mood and Affect: Mood normal.        Behavior: Behavior normal.     ED Results / Procedures / Treatments   Labs (all labs ordered are listed, but only abnormal results are displayed) Results for orders placed or performed during the hospital encounter of 08/03/20  Urinalysis, Routine w reflex microscopic Urine, Clean Catch  Result Value Ref Range   Color, Urine YELLOW YELLOW   APPearance CLEAR CLEAR   Specific Gravity, Urine 1.025 1.005 - 1.030   pH 6.0 5.0 - 8.0   Glucose, UA NEGATIVE NEGATIVE mg/dL   Hgb urine dipstick NEGATIVE NEGATIVE   Bilirubin Urine NEGATIVE NEGATIVE   Ketones, ur NEGATIVE NEGATIVE mg/dL   Protein, ur NEGATIVE NEGATIVE mg/dL   Nitrite NEGATIVE NEGATIVE   Leukocytes,Ua NEGATIVE NEGATIVE  Pregnancy, urine  Result Value Ref Range   Preg Test, Ur NEGATIVE NEGATIVE  CBC with Differential/Platelet  Result Value Ref  Range   WBC 6.7 4.0 - 10.5 K/uL   RBC 4.83 3.87 - 5.11 MIL/uL   Hemoglobin 13.3 12.0 - 15.0 g/dL   HCT 08/05/20 48.5 - 46.2 %   MCV 86.3 80.0 - 100.0  fL   MCH 27.5 26.0 - 34.0 pg   MCHC 31.9 30.0 - 36.0 g/dL   RDW 40.9 81.1 - 91.4 %   Platelets 377 150 - 400 K/uL   nRBC 0.0 0.0 - 0.2 %   Neutrophils Relative % 49 %   Neutro Abs 3.3 1.7 - 7.7 K/uL   Lymphocytes Relative 42 %   Lymphs Abs 2.8 0.7 - 4.0 K/uL   Monocytes Relative 7 %   Monocytes Absolute 0.5 0.1 - 1.0 K/uL   Eosinophils Relative 1 %   Eosinophils Absolute 0.1 0.0 - 0.5 K/uL   Basophils Relative 1 %   Basophils Absolute 0.0 0.0 - 0.1 K/uL   Immature Granulocytes 0 %   Abs Immature Granulocytes 0.01 0.00 - 0.07 K/uL   CT Renal Stone Study  Result Date: 08/03/2020 CLINICAL DATA:  Right lower quadrant pain radiating to the back bilaterally. Nausea for the last week, denies vomiting or diarrhea EXAM: CT ABDOMEN AND PELVIS WITHOUT CONTRAST TECHNIQUE: Multidetector CT imaging of the abdomen and pelvis was performed following the standard protocol without IV contrast. COMPARISON:  Pelvic ultrasound 08/05/2019 FINDINGS: Lower chest: Lung bases are clear. Normal heart size. No pericardial effusion. Hepatobiliary: No visible or contour deforming liver lesion. Smooth liver surface contour. Normal liver attenuation. Normal gallbladder and biliary tree without visible calcified gallstone or biliary ductal dilatation. Pancreas: No pancreatic ductal dilatation or surrounding inflammatory changes. Spleen: Normal in size. No concerning splenic lesions. Adrenals/Urinary Tract: Normal adrenal glands. No visible or contour deforming renal lesions. No urolithiasis or urinary tract dilatation. Urinary bladder is largely decompressed at the time of exam and therefore poorly evaluated by CT imaging. No gross bladder abnormality accounting for underdistention. Stomach/Bowel: Distal esophagus, stomach and duodenal sweep are unremarkable. No small bowel wall  thickening or dilatation. No evidence of obstruction. A normal appendix is visualized. No colonic dilatation or wall thickening. Vascular/Lymphatic: No significant vascular findings are present. No enlarged abdominal or pelvic lymph nodes. Reproductive: Anteverted uterus. Subserosal fibroid on comparison pelvic ultrasound is not well visualized on CT. No concerning adnexal lesions. Normal follicles in both ovaries. Other: No abdominopelvic free fluid or free gas. No bowel containing hernias. Musculoskeletal: No acute osseous abnormality or suspicious osseous lesion. IMPRESSION: No acute intra-abdominal process. Specifically, normal appendix, without urolithiasis or hydronephrosis. Electronically Signed   By: Kreg Shropshire M.D.   On: 08/03/2020 04:54    Radiology CT Renal Stone Study  Result Date: 08/03/2020 CLINICAL DATA:  Right lower quadrant pain radiating to the back bilaterally. Nausea for the last week, denies vomiting or diarrhea EXAM: CT ABDOMEN AND PELVIS WITHOUT CONTRAST TECHNIQUE: Multidetector CT imaging of the abdomen and pelvis was performed following the standard protocol without IV contrast. COMPARISON:  Pelvic ultrasound 08/05/2019 FINDINGS: Lower chest: Lung bases are clear. Normal heart size. No pericardial effusion. Hepatobiliary: No visible or contour deforming liver lesion. Smooth liver surface contour. Normal liver attenuation. Normal gallbladder and biliary tree without visible calcified gallstone or biliary ductal dilatation. Pancreas: No pancreatic ductal dilatation or surrounding inflammatory changes. Spleen: Normal in size. No concerning splenic lesions. Adrenals/Urinary Tract: Normal adrenal glands. No visible or contour deforming renal lesions. No urolithiasis or urinary tract dilatation. Urinary bladder is largely decompressed at the time of exam and therefore poorly evaluated by CT imaging. No gross bladder abnormality accounting for underdistention. Stomach/Bowel: Distal  esophagus, stomach and duodenal sweep are unremarkable. No small bowel wall thickening or dilatation. No evidence of obstruction. A normal appendix is visualized.  No colonic dilatation or wall thickening. Vascular/Lymphatic: No significant vascular findings are present. No enlarged abdominal or pelvic lymph nodes. Reproductive: Anteverted uterus. Subserosal fibroid on comparison pelvic ultrasound is not well visualized on CT. No concerning adnexal lesions. Normal follicles in both ovaries. Other: No abdominopelvic free fluid or free gas. No bowel containing hernias. Musculoskeletal: No acute osseous abnormality or suspicious osseous lesion. IMPRESSION: No acute intra-abdominal process. Specifically, normal appendix, without urolithiasis or hydronephrosis. Electronically Signed   By: Kreg Shropshire M.D.   On: 08/03/2020 04:54    Procedures Procedures   Medications Ordered in ED Medications  ketorolac (TORADOL) injection 60 mg (has no administration in time range)    ED Course  I have reviewed the triage vital signs and the nursing notes.  Pertinent labs & imaging results that were available during my care of the patient were reviewed by me and considered in my medical decision making (see chart for details).    I suspect this is menstrual cramping.  There is no pyuria to suggest STD.  She has no vaginal complaints no stones to cysts and the appendix is normal.  Patient is stable for discharge with close follow up.  You may take ibuprofen and tylenol for pain.    Beryl Hornberger was evaluated in Emergency Department on 08/03/2020 for the symptoms described in the history of present illness. She was evaluated in the context of the global COVID-19 pandemic, which necessitated consideration that the patient might be at risk for infection with the SARS-CoV-2 virus that causes COVID-19. Institutional protocols and algorithms that pertain to the evaluation of patients at risk for COVID-19 are in a state of  rapid change based on information released by regulatory bodies including the CDC and federal and state organizations. These policies and algorithms were followed during the patient's care in the ED.  Final Clinical Impression(s) / ED Diagnoses Return for intractable cough, coughing up blood, fevers >100.4 unrelieved by medication, shortness of breath, intractable vomiting, chest pain, shortness of breath, weakness, numbness, changes in speech, facial asymmetry, abdominal pain, passing out, Inability to tolerate liquids or food, cough, altered mental status or any concerns. No signs of systemic illness or infection. The patient is nontoxic-appearing on exam and vital signs are within normal limits.  I have reviewed the triage vital signs and the nursing notes. Pertinent labs & imaging results that were available during my care of the patient were reviewed by me and considered in my medical decision making (see chart for details). After history, exam, and medical workup I feel the patient has been appropriately medically screened and is safe for discharge home. Pertinent diagnoses were discussed with the patient. Patient was given return precautions.    Jayston Trevino, MD 08/03/20 [id number]

## 2021-11-01 ENCOUNTER — Emergency Department (HOSPITAL_BASED_OUTPATIENT_CLINIC_OR_DEPARTMENT_OTHER)
Admission: EM | Admit: 2021-11-01 | Discharge: 2021-11-01 | Disposition: A | Discharge status: Home / Self Care | Payer: Self-pay | Attending: Emergency Medicine | Admitting: Emergency Medicine

## 2021-11-01 ENCOUNTER — Emergency Department (HOSPITAL_BASED_OUTPATIENT_CLINIC_OR_DEPARTMENT_OTHER): Payer: Self-pay

## 2021-11-01 ENCOUNTER — Other Ambulatory Visit: Payer: Self-pay

## 2021-11-01 ENCOUNTER — Encounter (HOSPITAL_BASED_OUTPATIENT_CLINIC_OR_DEPARTMENT_OTHER): Payer: Self-pay

## 2021-11-01 DIAGNOSIS — R6884 Jaw pain: Secondary | ICD-10-CM | POA: Insufficient documentation

## 2021-11-01 DIAGNOSIS — R0789 Other chest pain: Secondary | ICD-10-CM | POA: Insufficient documentation

## 2021-11-01 LAB — BASIC METABOLIC PANEL
Anion gap: 8 (ref 5–15)
BUN: 17 mg/dL (ref 6–20)
CO2: 25 mmol/L (ref 22–32)
Calcium: 9.7 mg/dL (ref 8.9–10.3)
Chloride: 105 mmol/L (ref 98–111)
Creatinine, Ser: 0.8 mg/dL (ref 0.44–1.00)
GFR, Estimated: >60 mL/min (ref >60)
Glucose, Bld: 124 mg/dL — ABNORMAL HIGH (ref 70–99)
Potassium: 3.9 mmol/L (ref 3.5–5.1)
Sodium: 138 mmol/L (ref 135–145)

## 2021-11-01 LAB — CBC
HCT: 40.4 % (ref 36.0–46.0)
Hemoglobin: 12.7 g/dL (ref 12.0–15.0)
MCH: 26.2 pg (ref 26.0–34.0)
MCHC: 31.4 g/dL (ref 30.0–36.0)
MCV: 83.3 fL (ref 80.0–100.0)
Platelets: 517 10*3/uL — ABNORMAL HIGH (ref 150–400)
RBC: 4.85 MIL/uL (ref 3.87–5.11)
RDW: 15.7 % — ABNORMAL HIGH (ref 11.5–15.5)
WBC: 8.8 10*3/uL (ref 4.0–10.5)
nRBC: 0 % (ref 0.0–0.2)

## 2021-11-01 LAB — TROPONIN I (HIGH SENSITIVITY)
Troponin I (High Sensitivity): <2 ng/L (ref <18)
Troponin I (High Sensitivity): <2 ng/L (ref <18)

## 2021-11-01 LAB — HCG, SERUM, QUALITATIVE: Preg, Serum: NEGATIVE

## 2021-11-01 MED ORDER — ACETAMINOPHEN 500 MG PO TABS
1000.0000 mg | ORAL_TABLET | Freq: Once | ORAL | Status: AC
  Administered 2021-11-01: 1000 mg via ORAL
  Filled 2021-11-01: qty 2

## 2021-11-01 MED ORDER — MORPHINE SULFATE (PF) 4 MG/ML IV SOLN
4.0000 mg | Freq: Once | INTRAVENOUS | Status: AC
  Administered 2021-11-01: 4 mg via INTRAVENOUS
  Filled 2021-11-01: qty 1

## 2021-11-01 MED ORDER — METHOCARBAMOL 500 MG PO TABS: 500.0000 mg | ORAL_TABLET | Freq: Two times a day (BID) | ORAL | 0 refills | Status: DC

## 2021-11-01 MED ORDER — KETOROLAC TROMETHAMINE 15 MG/ML IJ SOLN
15.0000 mg | Freq: Once | INTRAMUSCULAR | Status: AC
  Administered 2021-11-01: 15 mg via INTRAVENOUS
  Filled 2021-11-01: qty 1

## 2021-11-01 NOTE — Discharge Instructions (Addendum)
Please follow-up with your primary care doctor. Return to the ER for any new or concerning symptoms.  Make sure you are drinking plenty of water.  Specifically please return immediately if you began coughing up blood or if new or worsening chest pain.  With your EKG, chest x-ray, labs looking normal I feel quite reassured.  However it is important to closely follow-up with your primary care provider to reevaluate you.  Please use Tylenol or ibuprofen for pain.  You may use 600 mg ibuprofen every 6 hours or 1000 mg of Tylenol every 6 hours.  You may choose to alternate between the 2.  This would be most effective.  Not to exceed 4 g of Tylenol within 24 hours.  Not to exceed 3200 mg ibuprofen 24 hours.

## 2021-11-01 NOTE — ED Notes (Signed)
HR 105-115, SpO2 94-99%, did not have SOB or dizziness, chest pain was not as bad ambulating around the entire nursing station.

## 2021-11-01 NOTE — ED Triage Notes (Addendum)
Pt was laying when she suddenly had a sharp pain in her jaw followed by L sided chest pain. Pt denies ShOB, nausea, or diaphoresis. Pt states the pain hurts with movement.

## 2021-11-01 NOTE — ED Provider Notes (Signed)
[facility] EMERGENCY DEPARTMENT Provider Note   CSN: [id number] Arrival date & time: 11/01/21  1750     History  Chief Complaint  Patient presents with   Chest Pain    Emily Lowery is a 31 y.o. female.   Chest Pain  Patient is a 31 year old female presented emergency room today with complaints of a sudden sharp pain in her left jaw followed by left-sided chest pain that has been constant does not seem to radiate anywhere.  It is located underneath her left breast.  She denies any difficulty breathing but states that she has some uncomfortable when she takes a deep breath.  No hemoptysis  No recent surgeries, hospitalization, long travel, hemoptysis, estrogen containing OCP, cancer history.  No unilateral leg swelling.  No history of PE or VTE.   Her OCP is progesterone only.  She denies any nausea vomiting or significant cough.  No other associate symptoms.  No aggravating or mitigating factors apart from slightly worse when sitting forward.  No history of pericarditis and no history of congestion, cough, fatigue, fevers     Home Medications Prior to Admission medications   Medication Sig Start Date End Date Taking? Authorizing Provider  methocarbamol (ROBAXIN) 500 MG tablet Take 1 tablet (500 mg total) by mouth 2 (two) times daily. 11/01/21  Yes Johnnathan Hagemeister S, PA  diazepam (VALIUM) 10 MG tablet Take 1/2 to 1 tablet at bedtime as needed for jaw spasms. 10/19/18   Molpus, John, MD  naproxen (NAPROSYN) 375 MG tablet Take 1 tablet twice daily as needed for jaw pain. 10/19/18   Molpus, John, MD  neomycin-polymyxin-hydrocortisone (CORTISPORIN) OTIC solution Place 3 drops into both ears 3 (three) times daily. 11/09/18   Saguier, Percell Miller, PA-C      Allergies    Patient has no known allergies.    Review of Systems   Review of Systems  Cardiovascular:  Positive for chest pain.   Physical Exam Updated Vital Signs BP 99/65 (BP Location: Right Arm)   Pulse 82   Temp 97.8  F (36.6 C) (Oral)   Resp 18   Ht 5\' 7"  (1.702 m)   Wt 108.9 kg   LMP 10/28/2021   SpO2 100%   BMI 37.59 kg/m  Physical Exam Vitals and nursing note reviewed.  Constitutional:      General: She is not in acute distress. HENT:     Head: Normocephalic and atraumatic.     Nose: Nose normal.  Eyes:     General: No scleral icterus. Cardiovascular:     Rate and Rhythm: Normal rate and regular rhythm.     Pulses: Normal pulses.     Heart sounds: Normal heart sounds.  Pulmonary:     Effort: Pulmonary effort is normal. No respiratory distress.     Breath sounds: No wheezing.  Chest:     Chest wall: Tenderness present.  Abdominal:     Palpations: Abdomen is soft.     Tenderness: There is no abdominal tenderness. There is no guarding or rebound.  Musculoskeletal:     Cervical back: Normal range of motion.     Right lower leg: No edema.     Left lower leg: No edema.  Skin:    General: Skin is warm and dry.     Capillary Refill: Capillary refill takes less than 2 seconds.  Neurological:     Mental Status: She is alert. Mental status is at baseline.  Psychiatric:  Mood and Affect: Mood normal.        Behavior: Behavior normal.    ED Results / Procedures / Treatments   Labs (all labs ordered are listed, but only abnormal results are displayed) Labs Reviewed  BASIC METABOLIC PANEL - Abnormal; Notable for the following components:      Result Value   Glucose, Bld 124 (*)    All other components within normal limits  CBC - Abnormal; Notable for the following components:   RDW 15.7 (*)    Platelets 517 (*)    All other components within normal limits  HCG, SERUM, QUALITATIVE  TROPONIN I (HIGH SENSITIVITY)  TROPONIN I (HIGH SENSITIVITY)    EKG None  Radiology DG Chest 2 View  Result Date: 11/01/2021 CLINICAL DATA:  Chest pain EXAM: CHEST - 2 VIEW COMPARISON:  Chest radiograph dated December 05, 2014. FINDINGS: The heart size and mediastinal contours are within normal  limits. Both lungs are clear. The visualized skeletal structures are unremarkable. IMPRESSION: No active cardiopulmonary disease. Electronically Signed   By: Keane Police D.O.   On: 11/01/2021 19:23    Procedures Procedures    Medications Ordered in ED Medications  morphine (PF) 4 MG/ML injection 4 mg (4 mg Intravenous Given 11/01/21 1903)  ketorolac (TORADOL) 15 MG/ML injection 15 mg (15 mg Intravenous Given 11/01/21 1934)  acetaminophen (TYLENOL) tablet 1,000 mg (1,000 mg Oral Given 11/01/21 1934)    ED Course/ Medical Decision Making/ A&P                           Medical Decision Making Amount and/or Complexity of Data Reviewed Labs: ordered. Radiology: ordered.  Risk OTC drugs. Prescription drug management.   This patient presents to the ED for concern of CP, this involves a number of treatment options, and is a complaint that carries with it a high risk of complications and morbidity.  The differential diagnosis includes The emergent causes of chest pain include: Acute coronary syndrome, tamponade, pericarditis/myocarditis, aortic dissection, pulmonary embolism, tension pneumothorax, pneumonia, and esophageal rupture.   I do not believe the patient has an emergent cause of chest pain, other urgent/non-acute considerations include, but are not limited to: chronic angina, aortic stenosis, cardiomyopathy, mitral valve prolapse, pulmonary hypertension, aortic insufficiency, right ventricular hypertrophy, pleuritis, bronchitis, pneumothorax, tumor, gastroesophageal reflux disease (GERD), esophageal spasm, Mallory-Weiss syndrome, peptic ulcer disease, pancreatitis, functional gastrointestinal pain, cervical or thoracic disk disease or arthritis, shoulder arthritis, costochondritis, subacromial bursitis, anxiety or panic attack, herpes zoster, breast disorders, chest wall tumors, thoracic outlet syndrome, mediastinitis.    Co morbidities: Discussed in HPI   Brief History:  Patient  is a 31 year old female presented emergency room today with complaints of a sudden sharp pain in her left jaw followed by left-sided chest pain that has been constant does not seem to radiate anywhere.  It is located underneath her left breast.  She denies any difficulty breathing but states that she has some uncomfortable when she takes a deep breath.  No hemoptysis  No recent surgeries, hospitalization, long travel, hemoptysis, estrogen containing OCP, cancer history.  No unilateral leg swelling.  No history of PE or VTE.   Her OCP is progesterone only.  She denies any nausea vomiting or significant cough.  No other associate symptoms.  No aggravating or mitigating factors apart from slightly worse when sitting forward.  No history of pericarditis and no history of congestion, cough, fatigue, fevers    EMR reviewed  including pt PMHx, past surgical history and past visits to ER.   See HPI for more details   Lab Tests:   I personally reviewed all laboratory work and imaging.  Metabolic panel without any acute abnormality specifically kidney function within normal limits and no significant electrolyte abnormalities. CBC without leukocytosis or significant anemia.   Troponin x2 within normal limits.  hCG negative for pregnancy.   Imaging Studies:  NAD. I personally reviewed all imaging studies and no acute abnormality found. I agree with radiology interpretation.    Cardiac Monitoring:  The patient was maintained on a cardiac monitor.  I personally viewed and interpreted the cardiac monitored which showed an underlying rhythm of: NSR EKG non-ischemic   Medicines ordered:  I ordered medication including Tylenol, Toradol, morphine for pain Reevaluation of the patient after these medicines showed that the patient resolved I have reviewed the patients home medicines and have made adjustments as needed     Critical Interventions:     Consults/Attending  Physician      Reevaluation:  After the interventions noted above I re-evaluated patient and found that they have :resolved   Social Determinants of Health:      Problem List / ED Course:  Patient with chest pain that is reproducible on my exam seems to be underneath the left breast in location.  No accompanying nausea, vomiting, SOB or diaphoresis she is also 31 years old.  I have very low suspicion for ACS.  PE considered however no episodes of tachycardia during my evaluation of patient or while she was on telemetry.  Reassuring work-up and PERC negative.   Dispostion:  After consideration of the diagnostic results and the patients response to treatment, I feel that the patent would benefit from outpatient follow-up with PCP.  Robaxin and Tylenol ibuprofen recommendations were provided to patient.  Return precautions discussed and she seems understand them.   Final Clinical Impression(s) / ED Diagnoses Final diagnoses:  Atypical chest pain    Rx / DC Orders ED Discharge Orders          Ordered    methocarbamol (ROBAXIN) 500 MG tablet  2 times daily        11/01/21 2149              Tedd Sias, [place] 11/01/21 2219    Isla Pence, MD 11/01/21 2255

## 2021-12-09 ENCOUNTER — Other Ambulatory Visit: Payer: Self-pay | Admitting: Physician Assistant

## 2021-12-09 DIAGNOSIS — E010 Iodine-deficiency related diffuse (endemic) goiter: Secondary | ICD-10-CM

## 2021-12-10 ENCOUNTER — Ambulatory Visit
Admission: RE | Admit: 2021-12-10 | Discharge: 2021-12-10 | Disposition: A | Discharge status: Home / Self Care | Payer: Commercial Managed Care - HMO | Source: Ambulatory Visit | Attending: Physician Assistant | Admitting: Physician Assistant

## 2021-12-10 DIAGNOSIS — E010 Iodine-deficiency related diffuse (endemic) goiter: Secondary | ICD-10-CM

## 2022-01-04 ENCOUNTER — Telehealth: Payer: Self-pay | Admitting: Hematology and Oncology

## 2022-01-04 NOTE — Telephone Encounter (Signed)
Scheduled appt per 7/25 referral. Pt is aware of appt date and time. Pt is aware to arrive 15 mins prior to appt time and to bring and updated insurance card. Pt is aware of appt location.   

## 2022-01-26 ENCOUNTER — Telehealth: Payer: Self-pay | Admitting: Hematology and Oncology

## 2022-01-26 NOTE — Telephone Encounter (Signed)
R/s pt's new hem appt, an earlier date became available. Pt is aware of new appt date/time.

## 2022-01-28 ENCOUNTER — Inpatient Hospital Stay: Payer: Commercial Managed Care - HMO | Attending: Hematology and Oncology | Admitting: Hematology and Oncology

## 2022-01-28 ENCOUNTER — Other Ambulatory Visit: Payer: Self-pay

## 2022-01-28 ENCOUNTER — Encounter: Payer: Self-pay | Admitting: Hematology and Oncology

## 2022-01-28 ENCOUNTER — Inpatient Hospital Stay: Payer: Commercial Managed Care - HMO

## 2022-01-28 VITALS — BP 116/81 | HR 96 | Temp 97.7°F | Resp 18 | Ht 67.0 in | Wt 239.9 lb

## 2022-01-28 DIAGNOSIS — E282 Polycystic ovarian syndrome: Secondary | ICD-10-CM | POA: Diagnosis not present

## 2022-01-28 DIAGNOSIS — D75839 Thrombocytosis, unspecified: Secondary | ICD-10-CM

## 2022-01-28 NOTE — Progress Notes (Signed)
[facility] CONSULT NOTE  Patient Care Team: Patient, No Pcp Per as PCP - General (General Practice)  CHIEF COMPLAINTS/PURPOSE OF CONSULTATION:  Thrombocytosis  ASSESSMENT & PLAN:  No problem-specific Assessment & Plan notes found for this encounter.  Orders Placed This Encounter  Procedures   CBC with Differential/Platelet    Standing Status:   Standing    Number of Occurrences:   22    Standing Expiration Date:   01/29/2023   Iron and Iron Binding Capacity ([facility] only)    Standing Status:   Future    Standing Expiration Date:   01/29/2023   Ferritin    Standing Status:   Future    Standing Expiration Date:   01/28/2023   This is a very pleasant 31 year old female patient who is referred to hematology for evaluation of persistent thrombocytosis over the past 3 months.  She denies any new complaints today.  Physical examination quite unremarkable.  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  Differential diagnosis  1. Primary thrombocytosis: Related to myeloproliferative disorders of the bone marrow especially essential thrombocytosis and CML. I would like to send for BCR-ABL as well as JAK-2 mutation analysis. Patient understands that JAK2 mutation is only present in 50% of essential thrombocytosis so the test is advantageous only if it is positive. If it is negative, it does not rule out. 2. Secondary/reactive thrombocytosis Different causes including infections, inflammation, iron deficiency.  I would like to send out for CBC, iron panel and ferritin.  Treatment options: 1. If it is primary essential thrombocytosis, treatment would depend on platelet count level as well as history of thrombosis. A. For low risk patients, (platelet counts less than 1000 and no history of blood clots) the treatment would be with aspirin therapy B. for high risk patients(platelet counts greater than 1000/history of blood clot) the treatment would be  platelet lowering therapy with aspirin 2. Treatment of secondary thrombocytosis would be to treat underlying cause. There would not be any risk of thrombosis with secondary thrombocytosis.  Return to clinic in 3 to 4 weeks to discuss the results of these tests.   She expressed understanding of the recommendations.  She should continue to follow-up with her PCP for management of thyroid nodules.  HISTORY OF PRESENTING ILLNESS:  Olen Pel 31 y.o. female is here because of thrombocytosis.  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  Patient did not quite know why she was referred to hematology.  She was hoping to see someone for some thyroid abnormalities.  She denies any complaints except for irregular menstruation secondary to PCOS and some migraine-like complaints for which she takes medroxyprogesterone and sumatriptan respectively.  She denies any known iron deficiency.  Although she reports her menstrual cycles is very sporadic, when she has them, these last for weeks and she describes them as heavy. She otherwise denies any autoimmune diseases, known iron deficiency or active infections. Rest of the pertinent 10 point ROS reviewed and negative.  REVIEW OF SYSTEMS:   Constitutional: Denies fevers, chills or abnormal night sweats Eyes: Denies blurriness of vision, double vision or watery eyes Ears, nose, mouth, throat, and face: Denies mucositis or sore throat Respiratory: Denies cough, dyspnea or wheezes Cardiovascular: Denies palpitation, chest discomfort or lower extremity swelling Gastrointestinal:  Denies nausea, heartburn or change in bowel habits Skin: Denies abnormal skin rashes Lymphatics: Denies new lymphadenopathy or  easy bruising Neurological:Denies numbness, tingling or new weaknesses Behavioral/Psych: Mood is stable, no new changes  All other systems were reviewed  with the patient and are negative.  MEDICAL HISTORY:  Past Medical History:  Diagnosis Date   Allergy    Chlamydia    Gonorrhea    Headache     SURGICAL HISTORY: Past Surgical History:  Procedure Laterality Date   TONSILLECTOMY     TONSILLECTOMY     WISDOM TOOTH EXTRACTION      SOCIAL HISTORY: Social History   Socioeconomic History   Marital status: Single    Spouse name: Not on file   Number of children: Not on file   Years of education: Not on file   Highest education level: Not on file  Occupational History   Not on file  Tobacco Use   Smoking status: Some Days    Years: 11.00    Types: Cigarettes   Smokeless tobacco: Never   Tobacco comments:    quit age20  Vaping Use   Vaping Use: Never used  Substance and Sexual Activity   Alcohol use: Yes    Comment: occ/rare   Drug use: Yes    Types: Marijuana   Sexual activity: Yes    Birth control/protection: None  Other Topics Concern   Not on file  Social History Narrative   Not on file   Social Determinants of Health   Financial Resource Strain: Not on file  Food Insecurity: Not on file  Transportation Needs: Not on file  Physical Activity: Not on file  Stress: Not on file  Social Connections: Not on file  Intimate Partner Violence: Not on file    FAMILY HISTORY: Family History  Problem Relation Age of Onset   Diabetes Mother    Diabetes Sister    Asthma Sister    Diabetes Maternal Grandmother    Hypertension Maternal Grandmother    Heart disease Maternal Grandmother     ALLERGIES:  has No Known Allergies.  MEDICATIONS:  Current Outpatient Medications  Medication Sig Dispense Refill   diazepam (VALIUM) 10 MG tablet Take 1/2 to 1 tablet at bedtime as needed for jaw spasms. 10 tablet 1   methocarbamol (ROBAXIN) 500 MG tablet Take 1 tablet (500 mg total) by mouth 2 (two) times daily. 20 tablet 0   naproxen (NAPROSYN) 375 MG tablet Take 1 tablet twice daily as needed for jaw pain. 20 tablet 1    neomycin-polymyxin-hydrocortisone (CORTISPORIN) OTIC solution Place 3 drops into both ears 3 (three) times daily. 10 mL 0   No current facility-administered medications for this visit.     PHYSICAL EXAMINATION: ECOG PERFORMANCE STATUS: 0 - Asymptomatic  Vitals:   01/28/22 1046  BP: 116/81  Pulse: 96  Resp: 18  Temp: 97.7 F (36.5 C)  SpO2: 100%   Filed Weights   01/28/22 1046  Weight: 239 lb 14.4 oz (108.8 kg)    GENERAL:alert, no distress and comfortable, obese SKIN: skin color, texture, turgor are normal, no rashes or significant lesions EYES: normal, conjunctiva are pink and non-injected, sclera clear OROPHARYNX:no exudate, no erythema and lips, buccal mucosa, and tongue normal  NECK: supple, thyroid normal size, non-tender, without nodularity LYMPH:  no palpable lymphadenopathy in the cervical, axillary  LUNGS: clear to auscultation and percussion with normal breathing effort HEART: regular rate & rhythm and no murmurs and no lower extremity edema ABDOMEN:abdomen soft, non-tender and normal bowel sounds Musculoskeletal:no cyanosis of digits and no clubbing  PSYCH: alert & oriented x  3 with fluent speech NEURO: no focal motor/sensory deficits  LABORATORY DATA:  I have reviewed the data as listed Lab Results  Component Value Date   WBC 8.8 11/01/2021   HGB 12.7 11/01/2021   HCT 40.4 11/01/2021   MCV 83.3 11/01/2021   PLT 517 (H) 11/01/2021     Chemistry      Component Value Date/Time   NA 138 11/01/2021 1840   K 3.9 11/01/2021 1840   CL 105 11/01/2021 1840   CO2 25 11/01/2021 1840   BUN 17 11/01/2021 1840   CREATININE 0.80 11/01/2021 1840      Component Value Date/Time   CALCIUM 9.7 11/01/2021 1840   ALKPHOS 52 04/22/2016 0720   AST 14 (L) 04/22/2016 0720   ALT 11 (L) 04/22/2016 0720   BILITOT 0.2 (L) 04/22/2016 0720       RADIOGRAPHIC STUDIES: I have personally reviewed the radiological images as listed and agreed with the findings in the  report. No results found.  All questions were answered. The patient knows to call the clinic with any problems, questions or concerns. I spent 30 minutes in the care of this patient including H and P, review of records, counseling and coordination of care.     Rachel Moulds, MD 01/28/2022 10:55 AM

## 2022-01-31 ENCOUNTER — Inpatient Hospital Stay: Payer: Commercial Managed Care - HMO | Admitting: Hematology and Oncology

## 2022-01-31 ENCOUNTER — Inpatient Hospital Stay: Payer: Commercial Managed Care - HMO

## 2022-02-01 ENCOUNTER — Inpatient Hospital Stay: Payer: Commercial Managed Care - HMO

## 2022-02-01 ENCOUNTER — Other Ambulatory Visit: Payer: Self-pay

## 2022-02-01 DIAGNOSIS — D75839 Thrombocytosis, unspecified: Secondary | ICD-10-CM | POA: Diagnosis not present

## 2022-02-01 LAB — CBC WITH DIFFERENTIAL/PLATELET
Abs Immature Granulocytes: 0.01 10*3/uL (ref 0.00–0.07)
Basophils Absolute: 0 10*3/uL (ref 0.0–0.1)
Basophils Relative: 0 %
Eosinophils Absolute: 0.1 10*3/uL (ref 0.0–0.5)
Eosinophils Relative: 1 %
HCT: 38.4 % (ref 36.0–46.0)
Hemoglobin: 12.4 g/dL (ref 12.0–15.0)
Immature Granulocytes: 0 %
Lymphocytes Relative: 42 %
Lymphs Abs: 2.9 10*3/uL (ref 0.7–4.0)
MCH: 26.1 pg (ref 26.0–34.0)
MCHC: 32.3 g/dL (ref 30.0–36.0)
MCV: 80.8 fL (ref 80.0–100.0)
Monocytes Absolute: 0.5 10*3/uL (ref 0.1–1.0)
Monocytes Relative: 7 %
Neutro Abs: 3.4 10*3/uL (ref 1.7–7.7)
Neutrophils Relative %: 50 %
Platelets: 420 10*3/uL — ABNORMAL HIGH (ref 150–400)
RBC: 4.75 MIL/uL (ref 3.87–5.11)
RDW: 15.7 % — ABNORMAL HIGH (ref 11.5–15.5)
WBC: 6.9 10*3/uL (ref 4.0–10.5)
nRBC: 0 % (ref 0.0–0.2)

## 2022-02-01 LAB — IRON AND IRON BINDING CAPACITY (CC-WL,HP ONLY)
Iron: 38 ug/dL (ref 28–170)
Saturation Ratios: 11 % (ref 10.4–31.8)
TIBC: 351 ug/dL (ref 250–450)
UIBC: 313 ug/dL (ref 148–442)

## 2022-02-01 LAB — FERRITIN: Ferritin: 22 ng/mL (ref 11–307)

## 2022-02-03 ENCOUNTER — Telehealth: Payer: Self-pay | Admitting: *Deleted

## 2022-02-03 NOTE — Telephone Encounter (Addendum)
-----   Message from Rachel Moulds, MD sent at 02/02/2022 10:15 PM EDT ----- Can you please ask the patient to take ferrous sulfate 65/325 mg once a day. Her ferritin is at the lower limit of normal.  Pt contacted - above reviewed including possible side effects of oral iron. Pt stating understanding of instructions including to call to this office if further concerns.

## 2022-02-28 DIAGNOSIS — N926 Irregular menstruation, unspecified: Secondary | ICD-10-CM | POA: Insufficient documentation

## 2022-02-28 DIAGNOSIS — G43019 Migraine without aura, intractable, without status migrainosus: Secondary | ICD-10-CM | POA: Insufficient documentation

## 2022-02-28 DIAGNOSIS — A749 Chlamydial infection, unspecified: Secondary | ICD-10-CM | POA: Insufficient documentation

## 2022-02-28 DIAGNOSIS — D75839 Thrombocytosis, unspecified: Secondary | ICD-10-CM | POA: Insufficient documentation

## 2022-02-28 NOTE — Progress Notes (Unsigned)
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 I would like to send for BCR-ABL as well as JAK-2 mutation analysis. Patient understands that JAK2 mutation is only present in 50% of essential thrombocytosis so the test is advantageous only if it is positive. If it is negative, it does not rule out. 2. Secondary/reactive thrombocytosis Different causes including infections, inflammation, iron deficiency.  I would like to send out for CBC, iron panel and ferritin.  CURRENT THERAPY:  INTERVAL HISTORY: Emily Lowery 31 y.o. 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  MEDICAL HISTORY: Past Medical History:  Diagnosis Date   Allergy    Chlamydia    Gonorrhea    Headache     SURGICAL HISTORY: Past Surgical History:  Procedure Laterality Date   TONSILLECTOMY     TONSILLECTOMY     WISDOM TOOTH EXTRACTION      SOCIAL HISTORY: Social History   Socioeconomic History   Marital status: Single    Spouse name: Not on file   Number of children: Not on file   Years of education: Not on file   Highest education level: Not on file  Occupational History   Not on file  Tobacco Use   Smoking status: Some Days    Years: 11.00    Types: Cigarettes   Smokeless tobacco: Never   Tobacco comments:    quit age20  Vaping Use   Vaping Use: Never used  Substance and Sexual Activity    Alcohol use: Yes    Comment: occ/rare   Drug use: Yes    Types: Marijuana   Sexual activity: Yes    Birth control/protection: None  Other Topics Concern   Not on file  Social History Narrative   Not on file   Social Determinants of Health   Financial Resource Strain: Not on file  Food Insecurity: Not on file  Transportation Needs: Not on file  Physical Activity: Not on file  Stress: Not on file  Social Connections: Not on file  Intimate Partner Violence: Not on file    FAMILY HISTORY: Family History  Problem Relation Age of Onset   Diabetes Mother    Diabetes Sister    Asthma Sister    Diabetes Maternal Grandmother    Hypertension Maternal Grandmother    Heart disease Maternal Grandmother     Review of Systems  Constitutional:  Negative for appetite change, chills, fatigue, fever and unexpected weight change.  HENT:   Negative for hearing loss, lump/mass and trouble swallowing.   Eyes:  Negative for eye problems and icterus.  Respiratory:  Negative for chest tightness, cough and shortness of breath.   Cardiovascular:  Negative for chest pain, leg swelling and palpitations.  Gastrointestinal:  Negative for abdominal distention, abdominal pain, constipation, diarrhea, nausea and vomiting.  Endocrine: Negative for hot flashes.  Genitourinary:  Negative for difficulty urinating.   Musculoskeletal:  Negative for arthralgias.  Skin:  Negative for itching and rash.  Neurological:  Negative for dizziness, extremity weakness, headaches and numbness.  Hematological:  Negative for adenopathy. Does not bruise/bleed easily.  Psychiatric/Behavioral:  Negative for depression. The patient is not nervous/anxious.       PHYSICAL EXAMINATION  ECOG PERFORMANCE STATUS: 1 - Symptomatic but completely ambulatory  There were no vitals filed for this visit.  Physical Exam Constitutional:      General: She is not in acute distress.    Appearance: Normal appearance. She is not  toxic-appearing.  HENT:     Head: Normocephalic and atraumatic.  Eyes:     General: No scleral icterus. Cardiovascular:     Rate and Rhythm: Normal rate and regular rhythm.     Pulses: Normal pulses.     Heart sounds: Normal heart sounds.  Pulmonary:     Effort: Pulmonary effort is normal.     Breath sounds: Normal breath sounds.  Abdominal:     General: Abdomen is flat. Bowel sounds are normal. There is no distension.     Palpations: Abdomen is soft.     Tenderness: There is no abdominal tenderness.  Musculoskeletal:        General: No swelling.     Cervical back: Neck supple.  Lymphadenopathy:     Cervical: No cervical adenopathy.  Skin:    General: Skin is warm and dry.     Findings: No rash.  Neurological:     General: No focal deficit present.     Mental Status: She is alert.  Psychiatric:        Mood and Affect: Mood normal.        Behavior: Behavior normal.     LABORATORY DATA:  CBC    Component Value Date/Time   WBC 6.9 02/01/2022 0807   RBC 4.75 02/01/2022 0807   HGB 12.4 02/01/2022 0807   HCT 38.4 02/01/2022 0807   PLT 420 (H) 02/01/2022 0807   MCV 80.8 02/01/2022 0807   MCH 26.1 02/01/2022 0807   MCHC 32.3 02/01/2022 0807   RDW 15.7 (H) 02/01/2022 0807   LYMPHSABS 2.9 02/01/2022 0807   MONOABS 0.5 02/01/2022 0807   EOSABS 0.1 02/01/2022 0807   BASOSABS 0.0 02/01/2022 0807    CMP     Component Value Date/Time   NA 138 11/01/2021 1840   K 3.9 11/01/2021 1840   CL 105 11/01/2021 1840   CO2 25 11/01/2021 1840   GLUCOSE 124 (H) 11/01/2021 1840   BUN 17 11/01/2021 1840   CREATININE 0.80 11/01/2021 1840   CALCIUM 9.7 11/01/2021 1840   PROT 7.6 04/22/2016 0720   ALBUMIN 4.3 04/22/2016 0720   AST 14 (L) 04/22/2016 0720   ALT 11 (L) 04/22/2016 0720   ALKPHOS 52 04/22/2016 0720   BILITOT 0.2 (L) 04/22/2016 0720   GFRNONAA >60 11/01/2021 1840   GFRAA >60 04/22/2016 0720       ASSESSMENT and THERAPY PLAN:   No problem-specific Assessment &  Plan notes found for this encounter.   All questions were answered. The patient knows to call the clinic with any problems, questions or concerns. We can certainly see the patient much sooner if necessary.  Total encounter time:*** minutes*in face-to-face visit time, chart review, lab review, care coordination, order entry, and documentation of the encounter time.    Lillard Anes, NP 02/28/22 11:28 AM Medical Oncology and Hematology [facility] [address] [place], [place] [address] Tel. [phone]    Fax. [phone]  *Total Encounter Time as defined by the  Centers for Medicare and Medicaid Services includes, in addition to the face-to-face time of a patient visit (documented in the note above) non-face-to-face time: obtaining and reviewing outside history, ordering and reviewing medications, tests or procedures, care coordination (communications with other health care professionals or caregivers) and documentation in the medical record.

## 2022-03-01 ENCOUNTER — Inpatient Hospital Stay: Payer: Commercial Managed Care - HMO

## 2022-03-01 ENCOUNTER — Other Ambulatory Visit: Payer: Self-pay

## 2022-03-01 ENCOUNTER — Inpatient Hospital Stay: Payer: Commercial Managed Care - HMO | Attending: Hematology and Oncology | Admitting: Adult Health

## 2022-03-01 ENCOUNTER — Encounter: Payer: Self-pay | Admitting: Adult Health

## 2022-03-01 VITALS — BP 124/87 | HR 89 | Temp 97.9°F | Resp 18 | Ht 67.0 in | Wt 239.9 lb

## 2022-03-01 DIAGNOSIS — D75839 Thrombocytosis, unspecified: Secondary | ICD-10-CM

## 2022-03-01 LAB — CBC WITH DIFFERENTIAL (CANCER CENTER ONLY)
Abs Immature Granulocytes: 0.01 10*3/uL (ref 0.00–0.07)
Basophils Absolute: 0 10*3/uL (ref 0.0–0.1)
Basophils Relative: 1 %
Eosinophils Absolute: 0.1 10*3/uL (ref 0.0–0.5)
Eosinophils Relative: 1 %
HCT: 41.3 % (ref 36.0–46.0)
Hemoglobin: 13.1 g/dL (ref 12.0–15.0)
Immature Granulocytes: 0 %
Lymphocytes Relative: 41 %
Lymphs Abs: 2.6 10*3/uL (ref 0.7–4.0)
MCH: 26.3 pg (ref 26.0–34.0)
MCHC: 31.7 g/dL (ref 30.0–36.0)
MCV: 82.9 fL (ref 80.0–100.0)
Monocytes Absolute: 0.4 10*3/uL (ref 0.1–1.0)
Monocytes Relative: 7 %
Neutro Abs: 3.1 10*3/uL (ref 1.7–7.7)
Neutrophils Relative %: 50 %
Platelet Count: 444 10*3/uL — ABNORMAL HIGH (ref 150–400)
RBC: 4.98 MIL/uL (ref 3.87–5.11)
RDW: 15.9 % — ABNORMAL HIGH (ref 11.5–15.5)
WBC Count: 6.3 10*3/uL (ref 4.0–10.5)
nRBC: 0 % (ref 0.0–0.2)

## 2022-03-01 LAB — CMP (CANCER CENTER ONLY)
ALT: 11 U/L (ref 0–44)
AST: 12 U/L — ABNORMAL LOW (ref 15–41)
Albumin: 4.3 g/dL (ref 3.5–5.0)
Alkaline Phosphatase: 67 U/L (ref 38–126)
Anion gap: 6 (ref 5–15)
BUN: 11 mg/dL (ref 6–20)
CO2: 26 mmol/L (ref 22–32)
Calcium: 9.4 mg/dL (ref 8.9–10.3)
Chloride: 105 mmol/L (ref 98–111)
Creatinine: 0.78 mg/dL (ref 0.44–1.00)
GFR, Estimated: >60 mL/min (ref >60)
Glucose, Bld: 134 mg/dL — ABNORMAL HIGH (ref 70–99)
Potassium: 4.2 mmol/L (ref 3.5–5.1)
Sodium: 137 mmol/L (ref 135–145)
Total Bilirubin: 0.3 mg/dL (ref 0.3–1.2)
Total Protein: 7.9 g/dL (ref 6.5–8.1)

## 2022-03-01 LAB — VITAMIN B12: Vitamin B-12: 207 pg/mL (ref 180–914)

## 2022-03-01 NOTE — Assessment & Plan Note (Signed)
I met with [facility] today and we discussed that she is here to talk about what is going on in her blood cells.  We reviewed that there are 3 main types of blood cells.  First there white blood cells which are immune cells.  Hers are normal.  Second there are red blood cells these are oxygen-carrying cells and are also measured in terms of hemoglobin.  Hers is normal.  Third there are platelets.  Platelets are clotting cells.  They are also called thrombocytes.  I reviewed with her that thrombocytosis means too many platelets.  We discussed that there are 2 kinds of thrombocytosis.  The first, and is primary thrombocytosis.  This means that the platelets are high and it is related to something going on within the blood, such as a Jak 2 mutation.  One of the diagnostic criteria in primary thrombocytosis is a sustained platelet count above 450.  We reviewed the next cons of thrombocytosis is reactive or secondary thrombocytosis.  This is where there is something going on in the body and the platelets are reacting to what ever is going on and therefore increased in number.  After discussion with the Judieth Keens she has both heavy menstrual cycles and sounds like she may have a B12 deficiency.  She has not yet started the iron that Dr. Chryl Heck recommended so she will going get that today and begin taking 1 tablet daily.  We will check her B12 level and if it is low she will start on sublingual B12 at 1000 mcg/day.  If low we will also add on an intrinsic factor antibody and antiparietal antibodies.  I also recommended that she limit her drinking on the weekend which she verbalized understanding of.  We will see her back in 12 weeks for labs and follow-up.

## 2022-03-02 LAB — INTRINSIC FACTOR ANTIBODIES: Intrinsic Factor: 1.2 AU/mL — ABNORMAL HIGH (ref 0.0–1.1)

## 2022-03-03 LAB — ANTI-PARIETAL ANTIBODY: Parietal Cell Antibody-IgG: 87.3 Units — ABNORMAL HIGH (ref 0.0–20.0)

## 2022-03-04 ENCOUNTER — Encounter: Payer: Self-pay | Admitting: *Deleted

## 2022-03-07 ENCOUNTER — Other Ambulatory Visit: Payer: Self-pay | Admitting: Hematology and Oncology

## 2022-03-07 ENCOUNTER — Other Ambulatory Visit: Payer: Self-pay

## 2022-03-07 ENCOUNTER — Inpatient Hospital Stay: Payer: Commercial Managed Care - HMO

## 2022-03-07 DIAGNOSIS — E538 Deficiency of other specified B group vitamins: Secondary | ICD-10-CM

## 2022-03-14 ENCOUNTER — Other Ambulatory Visit: Payer: Self-pay

## 2022-03-14 ENCOUNTER — Inpatient Hospital Stay: Payer: Commercial Managed Care - HMO | Attending: Hematology and Oncology

## 2022-03-14 DIAGNOSIS — E538 Deficiency of other specified B group vitamins: Secondary | ICD-10-CM | POA: Insufficient documentation

## 2022-03-14 MED ORDER — CYANOCOBALAMIN 1000 MCG/ML IJ SOLN
1000.0000 ug | Freq: Once | INTRAMUSCULAR | Status: AC
  Administered 2022-03-14: 1000 ug via INTRAMUSCULAR
  Filled 2022-03-14: qty 1

## 2022-03-14 NOTE — Patient Instructions (Signed)
Vitamin B12 Deficiency Vitamin B12 deficiency occurs when the body does not have enough of this important vitamin. The body needs this vitamin: To make red blood cells. To make DNA. This is the genetic material inside cells. To help the nerves work properly so they can carry messages from the brain to the body. Vitamin B12 deficiency can cause health problems, such as not having enough red blood cells in the blood (anemia). This can lead to nerve damage if untreated. What are the causes? This condition may be caused by: Not eating enough foods that contain vitamin B12. Not having enough stomach acid and digestive fluids to properly absorb vitamin B12 from the food that you eat. Having certain diseases that make it hard to absorb vitamin B12. These diseases include Crohn's disease, chronic pancreatitis, and cystic fibrosis. An autoimmune disorder in which the body does not make enough of a protein (intrinsic factor) within the stomach, resulting in not enough absorption of vitamin B12. Having a surgery in which part of the stomach or small intestine is removed. Taking certain medicines that make it hard for the body to absorb vitamin B12. These include: Heartburn medicines, such as antacids and proton pump inhibitors. Some medicines that are used to treat diabetes. What increases the risk? The following factors may make you more likely to develop a vitamin B12 deficiency: Being an older adult. Eating a vegetarian or vegan diet that does not include any foods that come from animals. Eating a poor diet while you are pregnant. Taking certain medicines. Having alcoholism. What are the signs or symptoms? In some cases, there are no symptoms of this condition. If the condition leads to anemia or nerve damage, various symptoms may occur, such as: Weakness. Tiredness (fatigue). Loss of appetite. Numbness or tingling in your hands and feet. Redness and burning of the tongue. Depression,  confusion, or memory problems. Trouble walking. If anemia is severe, symptoms can include: Shortness of breath. Dizziness. Rapid heart rate. How is this diagnosed? This condition may be diagnosed with a blood test to measure the level of vitamin B12 in your blood. You may also have other tests, including: A group of tests that measure certain characteristics of blood cells (complete blood count, CBC). A blood test to measure intrinsic factor. A procedure where a thin tube with a camera on the end is used to look into your stomach or intestines (endoscopy). Other tests may be needed to discover the cause of the deficiency. How is this treated? Treatment for this condition depends on the cause. This condition may be treated by: Changing your eating and drinking habits, such as: Eating more foods that contain vitamin B12. Drinking less alcohol or no alcohol. Getting vitamin B12 injections. Taking vitamin B12 supplements by mouth (orally). Your health care provider will tell you which dose is best for you. Follow these instructions at home: Eating and drinking  Include foods in your diet that come from animals and contain a lot of vitamin B12. These include: Meats and poultry. This includes beef, pork, chicken, turkey, and organ meats, such as liver. Seafood. This includes clams, rainbow trout, salmon, tuna, and haddock. Eggs. Dairy foods such as milk, yogurt, and cheese. Eat foods that have vitamin B12 added to them (are fortified), such as ready-to-eat breakfast cereals. Check the label on the package to see if a food is fortified. The items listed above may not be a complete list of foods and beverages you can eat and drink. Contact a dietitian for   more information. Alcohol use Do not drink alcohol if: Your health care provider tells you not to drink. You are pregnant, may be pregnant, or are planning to become pregnant. If you drink alcohol: Limit how much you have to: 0-1 drink a  day for women. 0-2 drinks a day for men. Know how much alcohol is in your drink. In the [place] one drink equals one 12 oz bottle of beer (355 mL), one 5 oz glass of wine (148 mL), or one 1 oz glass of hard liquor (44 mL). General instructions Get vitamin B12 injections if told to by your health care provider. Take supplements only as told by your health care provider. Follow the directions carefully. Keep all follow-up visits. This is important. Contact a health care provider if: Your symptoms come back. Your symptoms get worse or do not improve with treatment. Get help right away: You develop shortness of breath. You have a rapid heart rate. You have chest pain. You become dizzy or you faint. These symptoms may be an emergency. Get help right away. Call 911. Do not wait to see if the symptoms will go away. Do not drive yourself to the hospital. Summary Vitamin B12 deficiency occurs when the body does not have enough of this important vitamin. Common causes include not eating enough foods that contain vitamin B12, not being able to absorb vitamin B12 from the food that you eat, having a surgery in which part of the stomach or small intestine is removed, or taking certain medicines. Eat foods that have vitamin B12 in them. Treatment may include making a change in the way you eat and drink, getting vitamin B12 injections, or taking vitamin B12 supplements. This information is not intended to replace advice given to you by your health care provider. Make sure you discuss any questions you have with your health care provider. Document Revised: 01/22/2021 Document Reviewed: 01/22/2021 Elsevier Patient Education  2023 Elsevier Inc.  

## 2022-03-21 ENCOUNTER — Other Ambulatory Visit: Payer: Self-pay | Admitting: Adult Health

## 2022-03-21 ENCOUNTER — Inpatient Hospital Stay: Payer: Commercial Managed Care - HMO

## 2022-03-21 ENCOUNTER — Other Ambulatory Visit: Payer: Self-pay

## 2022-03-21 DIAGNOSIS — E538 Deficiency of other specified B group vitamins: Secondary | ICD-10-CM | POA: Diagnosis not present

## 2022-03-21 MED ORDER — CYANOCOBALAMIN 1000 MCG/ML IJ SOLN
1000.0000 ug | Freq: Once | INTRAMUSCULAR | Status: AC
  Administered 2022-03-21: 1000 ug via INTRAMUSCULAR
  Filled 2022-03-21: qty 1

## 2022-03-21 NOTE — Patient Instructions (Signed)
Vitamin B12 and Folate Test Why am I having this test? Vitamin B12 and folate (folic acid) are B vitamins needed to make red blood cells and keep your nervous system healthy. Vitamin B12 is in foods such as meats, eggs, dairy products, and fish. Folate is in fruits, beans, and leafy green vegetables. Some foods, such as whole grains, bread, and cereals have vitamin B12 added to them (are fortified). You may not have enough of these B vitamins (have a deficiency) if your diet lacks these vitamins. Low levels can also be caused by diseases or having had surgeries on your stomach or small intestine that interfere with your ability to absorb the vitamins from your food. You may have a vitamin B12 and folate test if: You have symptoms of vitamin B12 or folate deficiency, such as tiredness (fatigue), headache, confusion, poor balance, or tingling and numbness in your hands and feet. You are pregnant or breastfeeding. Women who are pregnant or breastfeeding need more folate and may need to take dietary supplements. Your red blood cell count is low (anemia). You are an older person and have mental confusion. You have a disease or condition that may lead to a deficiency of these B vitamins. What is being tested? This test measures the amount of vitamin B12 and folate in your blood. The tests for vitamin B12 and folate may be done together or separately. What kind of sample is taken?  A blood sample is required for this test. It is usually collected by inserting a needle into a blood vessel. How do I prepare for this test? Follow instructions from your health care provider about eating and drinking before the test. Tell a health care provider about: All medicines you are taking, including vitamins, herbs, eye drops, creams, and over-the-counter medicines. Any medical conditions you have. Whether you are pregnant or may be pregnant. How often you drink alcohol. How are the results reported? Your test  results will be reported as values that identify the amount of vitamin B12 and folate in your blood. Your health care provider will compare your results to normal ranges that were established after testing a large group of people (reference ranges). Reference ranges may vary among labs and hospitals. For this test, common reference ranges are: Vitamin B12: 160-950 pg/mL or 118-701 pmol/L (SI units). Folate: 5-25 ng/mL or 11-57 nmol/L (SI units). What do the results mean? Results within the reference range are considered normal. Vitamin B12 or folate levels that are lower than the reference range may be caused by: Poor nutrition or eating a vegetarian or vegan diet that does not include any foods that come from animals. Having alcoholism. Having certain diseases that make it hard to absorb vitamin B12. These diseases include Crohn's disease, chronic pancreatitis, and cystic fibrosis. Taking certain medicines. Having had surgeries on your stomach or small intestine. High levels of vitamin B12 are rare, but they may happen if you have: Cancer. Liver disease. High levels of folate may happen if: You have anemia. You are vegetarian. You have had a recent blood transfusion. Talk with your health care provider about what your results mean. Questions to ask your health care provider Ask your health care provider, or the department that is doing the test: When will my results be ready? How will I get my results? What are my treatment options? What other tests do I need? What are my next steps? Summary Vitamin B12 and folate (folic acid) are both B vitamins that are needed to   make red blood cells and to keep your nervous system healthy. You may not have enough B vitamins in your body if you do not get enough in your diet or if you have a disease that makes it hard to absorb vitamin B12. This test measures the amount of vitamin B12 and folate in your blood. A blood sample is required for the  test. Talk with your health care provider about what your results mean. This information is not intended to replace advice given to you by your health care provider. Make sure you discuss any questions you have with your health care provider. Document Revised: 01/22/2021 Document Reviewed: 01/22/2021 Elsevier Patient Education  2023 Elsevier Inc. 

## 2022-03-28 ENCOUNTER — Inpatient Hospital Stay: Payer: Commercial Managed Care - HMO

## 2022-03-28 ENCOUNTER — Other Ambulatory Visit: Payer: Self-pay

## 2022-03-28 DIAGNOSIS — E538 Deficiency of other specified B group vitamins: Secondary | ICD-10-CM

## 2022-03-28 MED ORDER — CYANOCOBALAMIN 1000 MCG/ML IJ SOLN
1000.0000 ug | Freq: Once | INTRAMUSCULAR | Status: AC
  Administered 2022-03-28: 1000 ug via INTRAMUSCULAR
  Filled 2022-03-28: qty 1

## 2022-04-25 ENCOUNTER — Other Ambulatory Visit: Payer: Self-pay

## 2022-04-25 ENCOUNTER — Inpatient Hospital Stay: Payer: Commercial Managed Care - HMO | Attending: Hematology and Oncology

## 2022-04-25 DIAGNOSIS — E538 Deficiency of other specified B group vitamins: Secondary | ICD-10-CM | POA: Diagnosis present

## 2022-04-25 MED ORDER — CYANOCOBALAMIN 1000 MCG/ML IJ SOLN
1000.0000 ug | Freq: Once | INTRAMUSCULAR | Status: AC
  Administered 2022-04-25: 1000 ug via INTRAMUSCULAR
  Filled 2022-04-25: qty 1

## 2022-05-25 ENCOUNTER — Telehealth: Payer: Self-pay

## 2022-05-25 ENCOUNTER — Other Ambulatory Visit: Payer: Self-pay

## 2022-05-25 ENCOUNTER — Inpatient Hospital Stay: Payer: Commercial Managed Care - HMO | Attending: Hematology and Oncology | Admitting: Adult Health

## 2022-05-25 ENCOUNTER — Inpatient Hospital Stay: Payer: Commercial Managed Care - HMO

## 2022-05-25 ENCOUNTER — Encounter: Payer: Self-pay | Admitting: Adult Health

## 2022-05-25 VITALS — BP 114/84 | HR 80 | Temp 97.9°F | Resp 16 | Ht 67.0 in | Wt 241.6 lb

## 2022-05-25 DIAGNOSIS — D75838 Other thrombocytosis: Secondary | ICD-10-CM | POA: Diagnosis not present

## 2022-05-25 DIAGNOSIS — D75839 Thrombocytosis, unspecified: Secondary | ICD-10-CM

## 2022-05-25 DIAGNOSIS — G629 Polyneuropathy, unspecified: Secondary | ICD-10-CM | POA: Insufficient documentation

## 2022-05-25 DIAGNOSIS — E538 Deficiency of other specified B group vitamins: Secondary | ICD-10-CM | POA: Insufficient documentation

## 2022-05-25 LAB — CBC WITH DIFFERENTIAL/PLATELET
Abs Immature Granulocytes: 0.01 10*3/uL (ref 0.00–0.07)
Basophils Absolute: 0 10*3/uL (ref 0.0–0.1)
Basophils Relative: 0 %
Eosinophils Absolute: 0.1 10*3/uL (ref 0.0–0.5)
Eosinophils Relative: 1 %
HCT: 39.2 % (ref 36.0–46.0)
Hemoglobin: 12.4 g/dL (ref 12.0–15.0)
Immature Granulocytes: 0 %
Lymphocytes Relative: 38 %
Lymphs Abs: 2.6 10*3/uL (ref 0.7–4.0)
MCH: 26.5 pg (ref 26.0–34.0)
MCHC: 31.6 g/dL (ref 30.0–36.0)
MCV: 83.8 fL (ref 80.0–100.0)
Monocytes Absolute: 0.4 10*3/uL (ref 0.1–1.0)
Monocytes Relative: 7 %
Neutro Abs: 3.7 10*3/uL (ref 1.7–7.7)
Neutrophils Relative %: 54 %
Platelets: 426 10*3/uL — ABNORMAL HIGH (ref 150–400)
RBC: 4.68 MIL/uL (ref 3.87–5.11)
RDW: 15.1 % (ref 11.5–15.5)
WBC: 6.8 10*3/uL (ref 4.0–10.5)
nRBC: 0 % (ref 0.0–0.2)

## 2022-05-25 MED ORDER — CYANOCOBALAMIN 1000 MCG/ML IJ SOLN
1000.0000 ug | Freq: Once | INTRAMUSCULAR | Status: AC
  Administered 2022-05-25: 1000 ug via INTRAMUSCULAR
  Filled 2022-05-25: qty 1

## 2022-05-25 NOTE — Assessment & Plan Note (Signed)
Due to the fact that her intrinsic factor and antiparietal antibody levels are both increased she will likely respond poorly to sublingual B12.  She is receiving B12 injections and we will continue to do so every 4 weeks.  Her symptoms of B12 deficiency including peripheral neuropathy have resolved.  We will repeat B12 levels at her f/u visit with [place] in March.

## 2022-05-25 NOTE — Patient Instructions (Signed)
Iron-Rich Diet  Iron is a mineral that helps your body produce hemoglobin. Hemoglobin is a protein in red blood cells that carries oxygen to your body's tissues. Eating too little iron may cause you to feel weak and tired, and it can increase your risk of infection. Iron is naturally found in many foods, and many foods have iron added to them (are iron-fortified). You may need to follow an iron-rich diet if you do not have enough iron in your body due to certain medical conditions. The amount of iron that you need each day depends on your age, your sex, and any medical conditions you have. Follow instructions from your health care provider or a dietitian about how much iron you should eat each day. What are tips for following this plan? Reading food labels Check food labels to see how many milligrams (mg) of iron are in each serving. Cooking Cook foods in pots and pans that are made from iron. Take these steps to make it easier for your body to absorb iron from certain foods: Soak beans overnight before cooking. Soak whole grains overnight and drain them before using. Ferment flours before baking, such as by using yeast in bread dough. Meal planning When you eat foods that contain iron, you should eat them with foods that are high in vitamin C. These include oranges, peppers, tomatoes, potatoes, and mangoes. Vitamin C helps your body absorb iron. Certain foods and drinks prevent your body from absorbing iron properly. Avoid eating these foods in the same meal as iron-rich foods or with iron supplements. These foods include: Coffee, black tea, and red wine. Milk, dairy products, and foods that are high in calcium. Beans and soybeans. Whole grains. General information Take iron supplements only as told by your health care provider. An overdose of iron can be life-threatening. If you were prescribed iron supplements, take them with orange juice or a vitamin C supplement. When you eat  iron-fortified foods or take an iron supplement, you should also eat foods that naturally contain iron, such as meat, poultry, and fish. Eating naturally iron-rich foods helps your body absorb the iron that is added to other foods or contained in a supplement. Iron from animal sources is better absorbed than iron from plant sources. What foods should I eat? Fruits Prunes. Raisins. Eat fruits high in vitamin C, such as oranges, grapefruits, and strawberries, with iron-rich foods. Vegetables Spinach (cooked). Green peas. Broccoli. Fermented vegetables. Eat vegetables high in vitamin C, such as leafy greens, potatoes, bell peppers, and tomatoes, with iron-rich foods. Grains Iron-fortified breakfast cereal. Iron-fortified whole-wheat bread. Enriched rice. Sprouted grains. Meats and other proteins Beef liver. Beef. Turkey. Chicken. Oysters. Shrimp. Tuna. Sardines. Chickpeas. Nuts. Tofu. Pumpkin seeds. Beverages Tomato juice. Fresh orange juice. Prune juice. Hibiscus tea. Iron-fortified instant breakfast shakes. Sweets and desserts Blackstrap molasses. Seasonings and condiments Tahini. Fermented soy sauce. Other foods Wheat germ. The items listed above may not be a complete list of recommended foods and beverages. Contact a dietitian for more information. What foods should I limit? These are foods that should be limited while eating iron-rich foods as they can reduce the absorption of iron in your body. Grains Whole grains. Bran cereal. Bran flour. Meats and other proteins Soybeans. Products made from soy protein. Black beans. Lentils. Mung beans. Split peas. Dairy Milk. Cream. Cheese. Yogurt. Cottage cheese. Beverages Coffee. Black tea. Red wine. Sweets and desserts Cocoa. Chocolate. Ice cream. Seasonings and condiments Basil. Oregano. Large amounts of parsley. The items listed   above may not be a complete list of foods and beverages you should limit. Contact a dietitian for more  information. Summary Iron is a mineral that helps your body produce hemoglobin. Hemoglobin is a protein in red blood cells that carries oxygen to your body's tissues. Iron is naturally found in many foods, and many foods have iron added to them (are iron-fortified). When you eat foods that contain iron, you should eat them with foods that are high in vitamin C. Vitamin C helps your body absorb iron. Certain foods and drinks prevent your body from absorbing iron properly, such as whole grains and dairy products. You should avoid eating these foods in the same meal as iron-rich foods or with iron supplements. This information is not intended to replace advice given to you by your health care provider. Make sure you discuss any questions you have with your health care provider. Document Revised: 05/11/2020 Document Reviewed: 05/11/2020 Elsevier Patient Education  2023 Elsevier Inc.  

## 2022-05-25 NOTE — Progress Notes (Signed)
[facility] Follow up:    Traci Sermon, PA-C [facility] [place] [address]   DIAGNOSIS: b12 deficiency  SUMMARY OF HEMATOLOGIC HISTORY: Emily Lowery was referred here by her primary care provider for concern of elevated platelet count.  On June 29 her platelet count was 415.  On July 19th it was 456.     Latest Reference Range & Units 08/16/14 13:45 04/22/16 07:20 07/30/16 06:03 08/03/20 05:00 11/01/21 18:40 02/01/22 08:07  Platelets 150 - 400 K/uL 332 325 391 377 517 (H) 420 (H)  (H): Data is abnormally high   2. Secondary/reactive thrombocytosis A.  Ferritin on 02/01/2022 22, has h/o heavy menstrual cycles, patient was recommended Ferrous Sulfate 65/325 daily. B.  B12 on 03/01/2022: 207, which is insufficient, patient drinks 12 shots of liquor on the weekend--counseled on ETOH use C.  Instrinsic Factor elevated at 1.2, Anti-parietal antibody elevated at 87.3% D. Began B12 injections weekly x 4 followed by every 4 weeks on 03/14/2022   CURRENT THERAPY: b12 injections; oral iron  INTERVAL HISTORY: Emily Lowery 31 y.o. female returns for f/u of her b12 deficiency/reactive thrombocytosis.  She has stopped drinking all ETOH and is receiving b12 injections every 4 weeks with good tolerance.  Before starting b12 deficiency she was experiencing peripheral neuropathy.  That has since stopped.    Emily Lowery was also taking oral iron for menorrhagia.  She was unable to tolerate this due to constipation.  She is taking a multivitamin.  She has abnormal cycles and follows with GYN.  She sees Dr. Charlesetta Garibaldi.     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  MEDICAL HISTORY: Past Medical History:  Diagnosis Date   Allergy    Chlamydia    Gonorrhea    Headache     SURGICAL HISTORY: Past Surgical History:  Procedure Laterality Date   TONSILLECTOMY     TONSILLECTOMY     WISDOM TOOTH EXTRACTION      SOCIAL HISTORY: Social History   Socioeconomic History   Marital status: Single    Spouse name: Not on file   Number of children: Not on file   Years of education: Not on file   Highest education level: Not on file  Occupational History   Not on file  Tobacco Use   Smoking status: Some Days    Years: 11.00    Types: Cigarettes   Smokeless tobacco: Never   Tobacco comments:    quit age20  Vaping Use   Vaping Use: Never used  Substance and Sexual Activity   Alcohol use: Yes    Comment: occ/rare   Drug use: Yes    Types: Marijuana   Sexual activity: Yes    Birth control/protection: None  Other Topics Concern   Not on file  Social History Narrative   Not on file   Social Determinants of Health   Financial Resource Strain: Not on file  Food Insecurity: Not on file  Transportation Needs: Not on file  Physical Activity: Not on file  Stress: Not on file  Social Connections: Not on file  Intimate Partner Violence: Not on file  FAMILY HISTORY: Family History  Problem Relation Age of Onset   Diabetes Mother    Diabetes Sister    Asthma Sister    Diabetes Maternal Grandmother    Hypertension Maternal Grandmother    Heart disease Maternal Grandmother     Review of Systems  Constitutional:  Negative for appetite change, chills, fatigue, fever and unexpected weight change.  HENT:   Negative for hearing loss, lump/mass and trouble swallowing.   Eyes:  Negative for eye problems and icterus.  Respiratory:  Negative for chest tightness, cough and shortness of breath.   Cardiovascular:  Negative for chest pain, leg swelling and palpitations.   Gastrointestinal:  Negative for abdominal distention, abdominal pain, constipation, diarrhea, nausea and vomiting.  Endocrine: Negative for hot flashes.  Genitourinary:  Negative for difficulty urinating.   Musculoskeletal:  Negative for arthralgias.  Skin:  Negative for itching and rash.  Neurological:  Negative for dizziness, extremity weakness, headaches and numbness.  Hematological:  Negative for adenopathy. Does not bruise/bleed easily.  Psychiatric/Behavioral:  Negative for depression. The patient is not nervous/anxious.       PHYSICAL EXAMINATION  ECOG PERFORMANCE STATUS: 0 - Asymptomatic  Vitals:   05/25/22 0910  BP: 114/84  Pulse: 80  Resp: 16  Temp: 97.9 F (36.6 C)  SpO2: 98%    Physical Exam Constitutional:      General: She is not in acute distress.    Appearance: Normal appearance. She is not toxic-appearing.  HENT:     Head: Normocephalic and atraumatic.  Eyes:     General: No scleral icterus. Cardiovascular:     Rate and Rhythm: Normal rate and regular rhythm.     Pulses: Normal pulses.     Heart sounds: Normal heart sounds.  Pulmonary:     Effort: Pulmonary effort is normal.     Breath sounds: Normal breath sounds.  Abdominal:     General: Abdomen is flat. Bowel sounds are normal. There is no distension.     Palpations: Abdomen is soft.     Tenderness: There is no abdominal tenderness.  Musculoskeletal:        General: No swelling.     Cervical back: Neck supple.  Lymphadenopathy:     Cervical: No cervical adenopathy.  Skin:    General: Skin is warm and dry.     Findings: No rash.  Neurological:     General: No focal deficit present.     Mental Status: She is alert.  Psychiatric:        Mood and Affect: Mood normal.        Behavior: Behavior normal.     LABORATORY DATA:  CBC    Component Value Date/Time   WBC 6.8 05/25/2022 0858   RBC 4.68 05/25/2022 0858   HGB 12.4 05/25/2022 0858   HGB 13.1 03/01/2022 1054   HCT 39.2  05/25/2022 0858   PLT 426 (H) 05/25/2022 0858   PLT 444 (H) 03/01/2022 1054   MCV 83.8 05/25/2022 0858   MCH 26.5 05/25/2022 0858   MCHC 31.6 05/25/2022 0858   RDW 15.1 05/25/2022 0858   LYMPHSABS 2.6 05/25/2022 0858   MONOABS 0.4 05/25/2022 0858   EOSABS 0.1 05/25/2022 0858   BASOSABS 0.0 05/25/2022 0858     ASSESSMENT and THERAPY PLAN:   Thrombocytosis Her platelet count today is 422.  Her thrombocytosis is likely reactive and this level is not sufficient enough to increase her risk for CVA or other health concerns.  We are going  to continue following her iron levels due to her irregular menstrual cycles and difficulty tolerating oral iron.  If decreased we can administer iron IV.  She was recommended to continue to f/u with gyn about her irregular menses.  B12 deficiency Due to the fact that her intrinsic factor and antiparietal antibody levels are both increased she will likely respond poorly to sublingual B12.  She is receiving B12 injections and we will continue to do so every 4 weeks.  Her symptoms of B12 deficiency including peripheral neuropathy have resolved.  We will repeat B12 levels at her f/u visit with [place] in March.     All questions were answered. The patient knows to call the clinic with any problems, questions or concerns. We can certainly Lowery the patient much sooner if necessary.  Total encounter time:30 minutes*in face-to-face visit time, chart review, lab review, care coordination, order entry, and documentation of the encounter time.    Wilber Bihari, NP 05/25/22 10:12 AM Medical Oncology and Hematology [facility] [place], [place] [address] Tel. [phone]    Fax. [phone]  *Total Encounter Time as defined by the Centers for Medicare and Medicaid Services includes, in addition to the face-to-face time of a patient visit (documented in the note above) non-face-to-face time: obtaining and reviewing outside history,  ordering and reviewing medications, tests or procedures, care coordination (communications with other health care professionals or caregivers) and documentation in the medical record.

## 2022-05-25 NOTE — Assessment & Plan Note (Signed)
Her platelet count today is 422.  Her thrombocytosis is likely reactive and this level is not sufficient enough to increase her risk for CVA or other health concerns.  We are going to continue following her iron levels due to her irregular menstrual cycles and difficulty tolerating oral iron.  If decreased we can administer iron IV.  She was recommended to continue to f/u with gyn about her irregular menses.

## 2022-05-25 NOTE — Patient Instructions (Signed)
Vitamin B12 Deficiency Vitamin B12 deficiency occurs when the body does not have enough of this important vitamin. The body needs this vitamin: To make red blood cells. To make DNA. This is the genetic material inside cells. To help the nerves work properly so they can carry messages from the brain to the body. Vitamin B12 deficiency can cause health problems, such as not having enough red blood cells in the blood (anemia). This can lead to nerve damage if untreated. What are the causes? This condition may be caused by: Not eating enough foods that contain vitamin B12. Not having enough stomach acid and digestive fluids to properly absorb vitamin B12 from the food that you eat. Having certain diseases that make it hard to absorb vitamin B12. These diseases include Crohn's disease, chronic pancreatitis, and cystic fibrosis. An autoimmune disorder in which the body does not make enough of a protein (intrinsic factor) within the stomach, resulting in not enough absorption of vitamin B12. Having a surgery in which part of the stomach or small intestine is removed. Taking certain medicines that make it hard for the body to absorb vitamin B12. These include: Heartburn medicines, such as antacids and proton pump inhibitors. Some medicines that are used to treat diabetes. What increases the risk? The following factors may make you more likely to develop a vitamin B12 deficiency: Being an older adult. Eating a vegetarian or vegan diet that does not include any foods that come from animals. Eating a poor diet while you are pregnant. Taking certain medicines. Having alcoholism. What are the signs or symptoms? In some cases, there are no symptoms of this condition. If the condition leads to anemia or nerve damage, various symptoms may occur, such as: Weakness. Tiredness (fatigue). Loss of appetite. Numbness or tingling in your hands and feet. Redness and burning of the tongue. Depression,  confusion, or memory problems. Trouble walking. If anemia is severe, symptoms can include: Shortness of breath. Dizziness. Rapid heart rate. How is this diagnosed? This condition may be diagnosed with a blood test to measure the level of vitamin B12 in your blood. You may also have other tests, including: A group of tests that measure certain characteristics of blood cells (complete blood count, CBC). A blood test to measure intrinsic factor. A procedure where a thin tube with a camera on the end is used to look into your stomach or intestines (endoscopy). Other tests may be needed to discover the cause of the deficiency. How is this treated? Treatment for this condition depends on the cause. This condition may be treated by: Changing your eating and drinking habits, such as: Eating more foods that contain vitamin B12. Drinking less alcohol or no alcohol. Getting vitamin B12 injections. Taking vitamin B12 supplements by mouth (orally). Your health care provider will tell you which dose is best for you. Follow these instructions at home: Eating and drinking  Include foods in your diet that come from animals and contain a lot of vitamin B12. These include: Meats and poultry. This includes beef, pork, chicken, turkey, and organ meats, such as liver. Seafood. This includes clams, rainbow trout, salmon, tuna, and haddock. Eggs. Dairy foods such as milk, yogurt, and cheese. Eat foods that have vitamin B12 added to them (are fortified), such as ready-to-eat breakfast cereals. Check the label on the package to see if a food is fortified. The items listed above may not be a complete list of foods and beverages you can eat and drink. Contact a dietitian for   more information. Alcohol use Do not drink alcohol if: Your health care provider tells you not to drink. You are pregnant, may be pregnant, or are planning to become pregnant. If you drink alcohol: Limit how much you have to: 0-1 drink a  day for women. 0-2 drinks a day for men. Know how much alcohol is in your drink. In the [place] one drink equals one 12 oz bottle of beer (355 mL), one 5 oz glass of wine (148 mL), or one 1 oz glass of hard liquor (44 mL). General instructions Get vitamin B12 injections if told to by your health care provider. Take supplements only as told by your health care provider. Follow the directions carefully. Keep all follow-up visits. This is important. Contact a health care provider if: Your symptoms come back. Your symptoms get worse or do not improve with treatment. Get help right away: You develop shortness of breath. You have a rapid heart rate. You have chest pain. You become dizzy or you faint. These symptoms may be an emergency. Get help right away. Call 911. Do not wait to see if the symptoms will go away. Do not drive yourself to the hospital. Summary Vitamin B12 deficiency occurs when the body does not have enough of this important vitamin. Common causes include not eating enough foods that contain vitamin B12, not being able to absorb vitamin B12 from the food that you eat, having a surgery in which part of the stomach or small intestine is removed, or taking certain medicines. Eat foods that have vitamin B12 in them. Treatment may include making a change in the way you eat and drink, getting vitamin B12 injections, or taking vitamin B12 supplements. This information is not intended to replace advice given to you by your health care provider. Make sure you discuss any questions you have with your health care provider. Document Revised: 01/22/2021 Document Reviewed: 01/22/2021 Elsevier Patient Education  2023 Elsevier Inc.  

## 2022-05-25 NOTE — Telephone Encounter (Signed)
Attempted to call [facility], to obtain history. LVM for medical records to fax records to our office.

## 2022-05-30 ENCOUNTER — Telehealth: Payer: Self-pay | Admitting: *Deleted

## 2022-05-30 NOTE — Telephone Encounter (Addendum)
Contacted patient per MD directions below. Patient verbalized understanding.   ----- Message from Rachel Moulds, MD sent at 05/26/2022  6:26 PM EST ----- Platelets appear to be stable over the past 3 months.  Okay to continue monitoring.

## 2022-06-03 ENCOUNTER — Encounter: Payer: Self-pay | Admitting: Hematology and Oncology

## 2022-09-02 DIAGNOSIS — N921 Excessive and frequent menstruation with irregular cycle: Secondary | ICD-10-CM | POA: Diagnosis not present

## 2022-09-18 IMAGING — CT CT RENAL STONE PROTOCOL
2 of 4 series · 16 of 46 positions shown, 18 images · non-contrast
Comparison: Pelvic ultrasound 08/05/2019

CLINICAL DATA: Right lower quadrant pain radiating to the back
bilaterally. Nausea for the last week, denies vomiting or diarrhea

EXAM:
CT ABDOMEN AND PELVIS WITHOUT CONTRAST
TECHNIQUE: Multidetector CT imaging of the abdomen and pelvis was performed
following the standard protocol without IV contrast.

[Series 2: axial st · axial · 0.60mm/px · z∈[+748,+1178]mm · 13 of 94 slices shown, 15 images]
[im 4/94  soft-tissue]
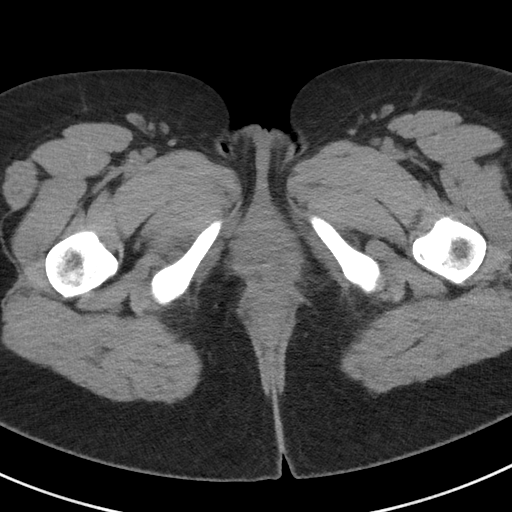
[im 4/94  bone]
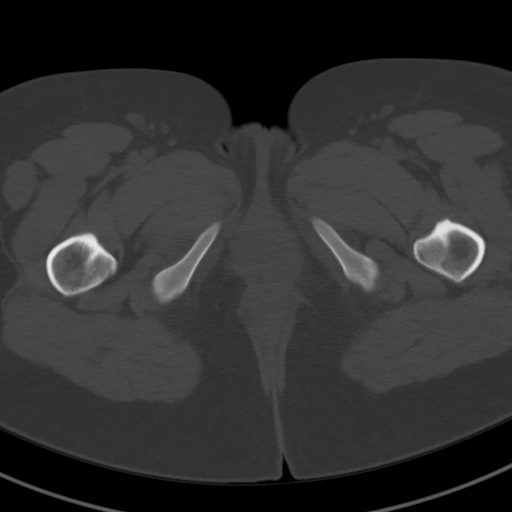
[im 11/94  soft-tissue]
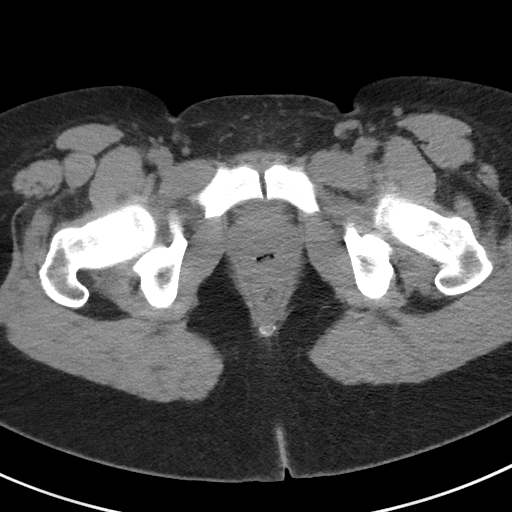
[im 18/94  soft-tissue]
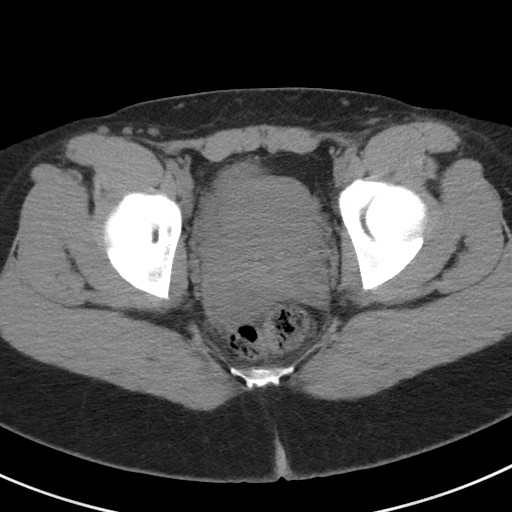
[im 26/94  soft-tissue]
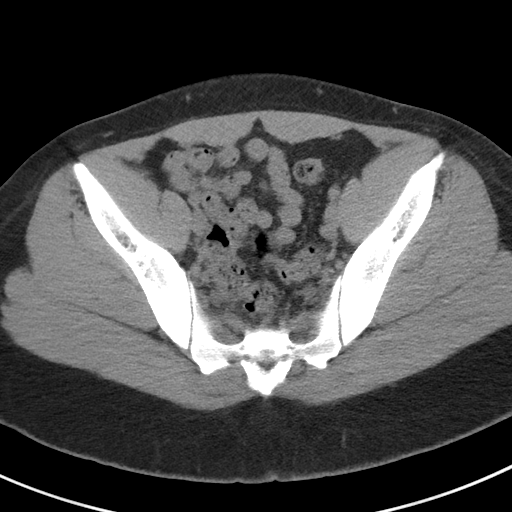
[im 33/94  soft-tissue]
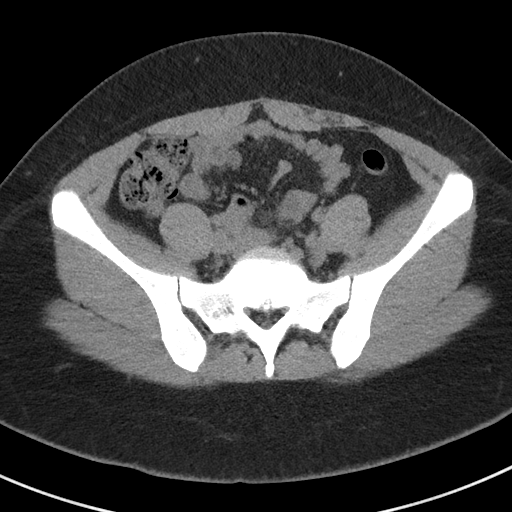
[im 40/94  soft-tissue]
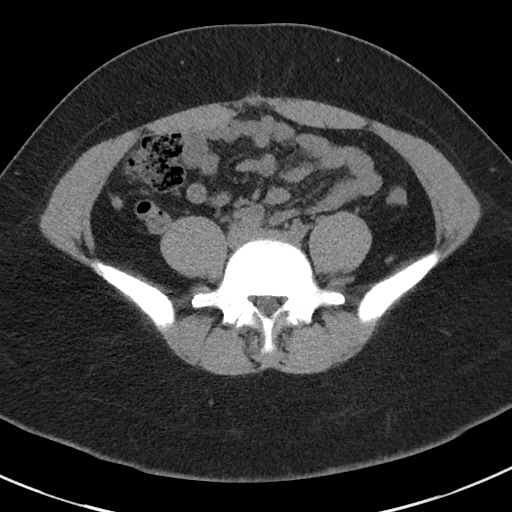
[im 47/94  soft-tissue]
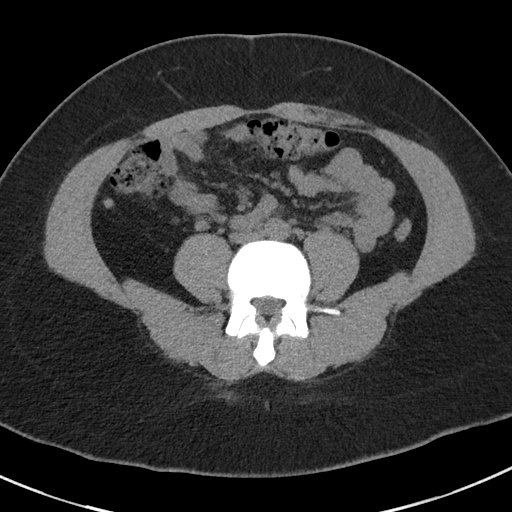
[im 54/94  soft-tissue]
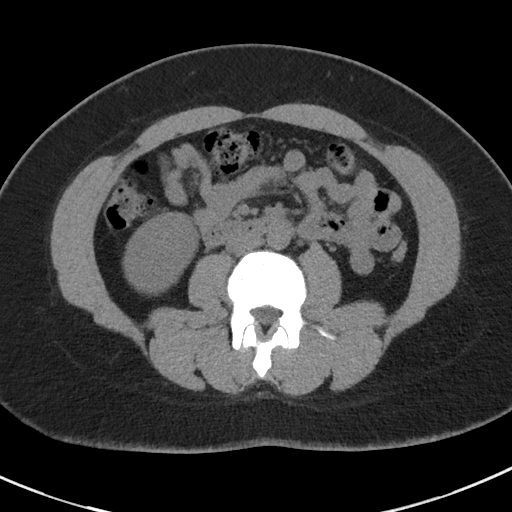
[im 61/94  soft-tissue]
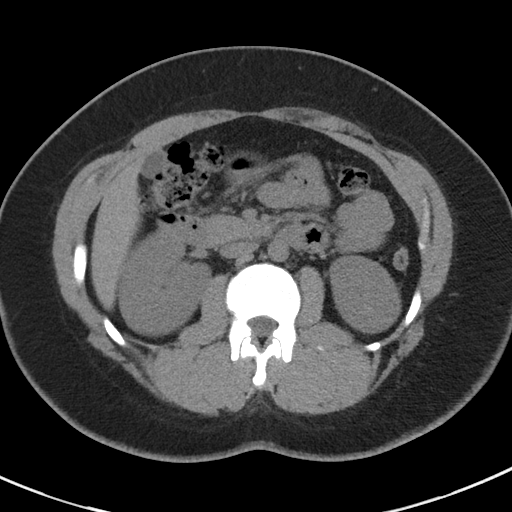
[im 61/94  bone]
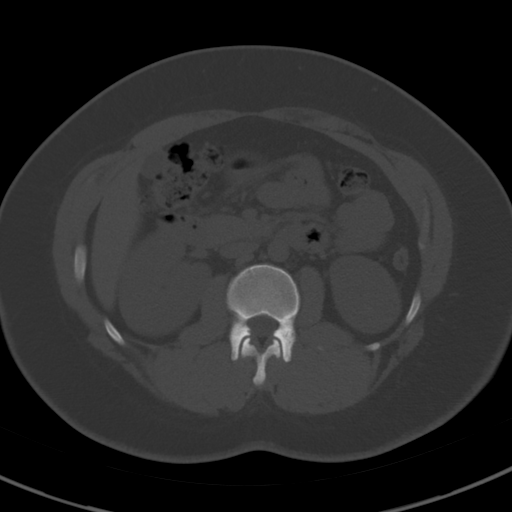
[im 68/94  soft-tissue]
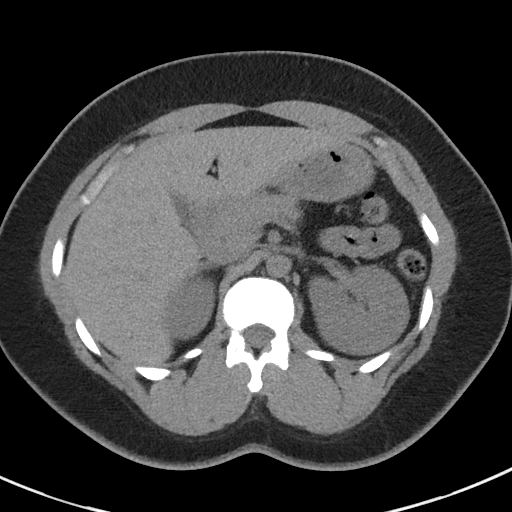
[im 76/94  soft-tissue]
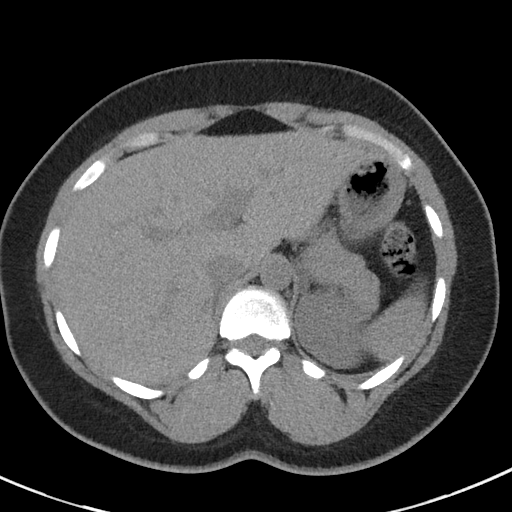
[im 83/94  soft-tissue]
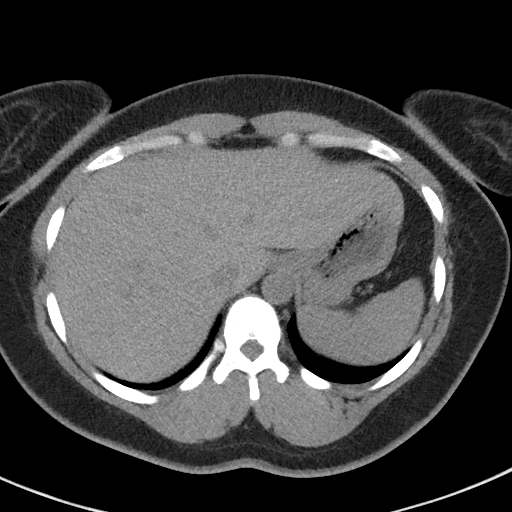
[im 90/94  soft-tissue]
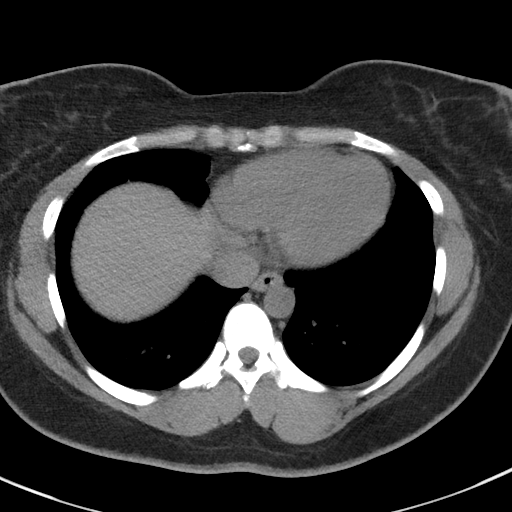

[Series 4: coronal st · coronal · 0.84mm/px · 3 of 87 slices shown]
[im 29/87  soft-tissue]
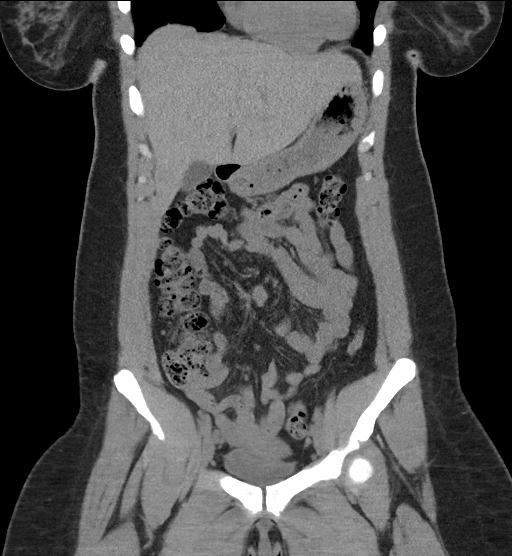
[im 39/87  soft-tissue]
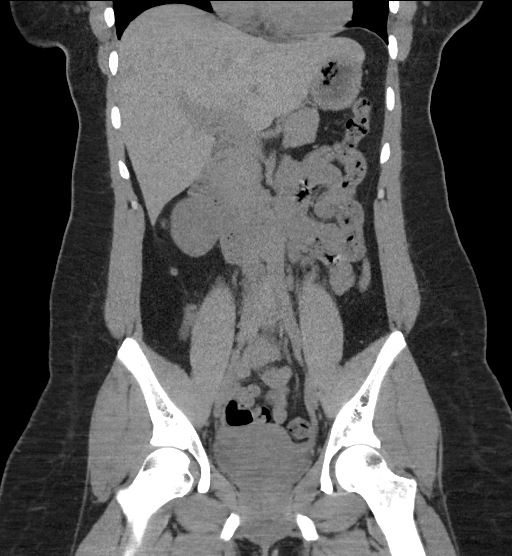
[im 48/87  soft-tissue]
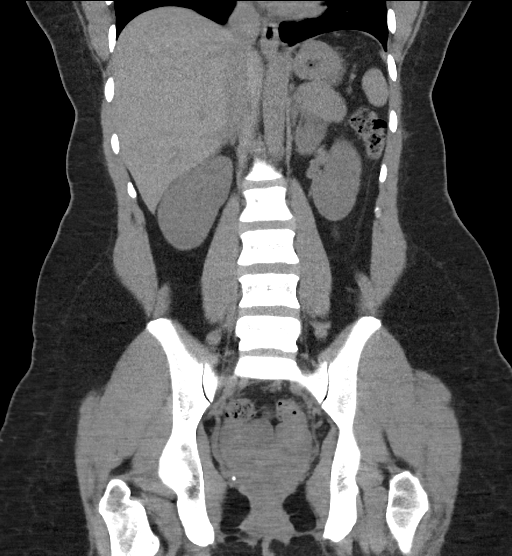

[16 of 46 positions shown; findings below may reference images not displayed]

FINDINGS: Lower chest: Lung bases are clear. Normal heart size. No pericardial
effusion.

Hepatobiliary: No visible or contour deforming liver lesion. Smooth
liver surface contour. Normal liver attenuation. Normal gallbladder
and biliary tree without visible calcified gallstone or biliary
ductal dilatation.

Pancreas: No pancreatic ductal dilatation or surrounding
inflammatory changes.

Spleen: Normal in size. No concerning splenic lesions.

Adrenals/Urinary Tract: Normal adrenal glands. No visible or contour
deforming renal lesions. No urolithiasis or urinary tract
dilatation. Urinary bladder is largely decompressed at the time of
exam and therefore poorly evaluated by CT imaging. No gross bladder
abnormality accounting for underdistention.

Stomach/Bowel: Distal esophagus, stomach and duodenal sweep are
unremarkable. No small bowel wall thickening or dilatation. No
evidence of obstruction. A normal appendix is visualized. No colonic
dilatation or wall thickening.

Vascular/Lymphatic: No significant vascular findings are present. No
enlarged abdominal or pelvic lymph nodes.

Reproductive: Anteverted uterus. Subserosal fibroid on comparison
pelvic ultrasound is not well visualized on CT. No concerning
adnexal lesions. Normal follicles in both ovaries.

Other: No abdominopelvic free fluid or free gas. No bowel containing
hernias.

Musculoskeletal: No acute osseous abnormality or suspicious osseous
lesion.
IMPRESSION: No acute intra-abdominal process. Specifically, normal appendix,
without urolithiasis or hydronephrosis.

## 2022-12-13 DIAGNOSIS — Z113 Encounter for screening for infections with a predominantly sexual mode of transmission: Secondary | ICD-10-CM | POA: Diagnosis not present

## 2022-12-13 DIAGNOSIS — N926 Irregular menstruation, unspecified: Secondary | ICD-10-CM | POA: Diagnosis not present

## 2022-12-13 DIAGNOSIS — N898 Other specified noninflammatory disorders of vagina: Secondary | ICD-10-CM | POA: Diagnosis not present

## 2022-12-29 ENCOUNTER — Encounter (HOSPITAL_BASED_OUTPATIENT_CLINIC_OR_DEPARTMENT_OTHER): Payer: Self-pay

## 2022-12-29 ENCOUNTER — Other Ambulatory Visit: Payer: Self-pay

## 2022-12-29 ENCOUNTER — Emergency Department (HOSPITAL_BASED_OUTPATIENT_CLINIC_OR_DEPARTMENT_OTHER)
Admission: EM | Admit: 2022-12-29 | Discharge: 2022-12-29 | Disposition: A | Discharge status: Home / Self Care | Payer: 59 | Attending: Emergency Medicine | Admitting: Emergency Medicine

## 2022-12-29 ENCOUNTER — Encounter: Payer: Self-pay | Admitting: Hematology and Oncology

## 2022-12-29 DIAGNOSIS — M542 Cervicalgia: Secondary | ICD-10-CM | POA: Insufficient documentation

## 2022-12-29 DIAGNOSIS — M549 Dorsalgia, unspecified: Secondary | ICD-10-CM | POA: Diagnosis not present

## 2022-12-29 DIAGNOSIS — Y92410 Unspecified street and highway as the place of occurrence of the external cause: Secondary | ICD-10-CM | POA: Diagnosis not present

## 2022-12-29 MED ORDER — METHOCARBAMOL 500 MG PO TABS: 500.0000 mg | ORAL_TABLET | Freq: Four times a day (QID) | ORAL | 0 refills | Status: AC

## 2022-12-29 MED ORDER — IBUPROFEN 800 MG PO TABS: 800.0000 mg | ORAL_TABLET | Freq: Three times a day (TID) | ORAL | 0 refills | Status: AC | PRN

## 2022-12-29 MED ORDER — IBUPROFEN 800 MG PO TABS
800.0000 mg | ORAL_TABLET | Freq: Once | ORAL | Status: AC | PRN
  Administered 2022-12-29: 800 mg via ORAL
  Filled 2022-12-29: qty 1

## 2022-12-29 MED ORDER — METHOCARBAMOL 500 MG PO TABS
500.0000 mg | ORAL_TABLET | Freq: Once | ORAL | Status: AC
  Administered 2022-12-29: 500 mg via ORAL
  Filled 2022-12-29: qty 1

## 2022-12-29 NOTE — Discharge Instructions (Signed)
Return if any problems. See your Physician for recheck if symptoms persist  
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
## 2023-01-03 DIAGNOSIS — M542 Cervicalgia: Secondary | ICD-10-CM | POA: Diagnosis not present

## 2023-01-03 DIAGNOSIS — M546 Pain in thoracic spine: Secondary | ICD-10-CM | POA: Diagnosis not present

## 2023-01-20 ENCOUNTER — Encounter: Payer: Self-pay | Admitting: Hematology and Oncology

## 2023-01-20 ENCOUNTER — Encounter (HOSPITAL_BASED_OUTPATIENT_CLINIC_OR_DEPARTMENT_OTHER): Payer: Self-pay | Admitting: Emergency Medicine

## 2023-01-20 ENCOUNTER — Emergency Department (HOSPITAL_BASED_OUTPATIENT_CLINIC_OR_DEPARTMENT_OTHER)
Admission: EM | Admit: 2023-01-20 | Discharge: 2023-01-20 | Disposition: A | Discharge status: Home / Self Care | Payer: 59 | Attending: Emergency Medicine | Admitting: Emergency Medicine

## 2023-01-20 ENCOUNTER — Other Ambulatory Visit (HOSPITAL_BASED_OUTPATIENT_CLINIC_OR_DEPARTMENT_OTHER): Payer: Self-pay

## 2023-01-20 ENCOUNTER — Other Ambulatory Visit: Payer: Self-pay

## 2023-01-20 DIAGNOSIS — L03011 Cellulitis of right finger: Secondary | ICD-10-CM | POA: Diagnosis not present

## 2023-01-20 DIAGNOSIS — M7989 Other specified soft tissue disorders: Secondary | ICD-10-CM | POA: Diagnosis not present

## 2023-01-20 MED ORDER — AMOXICILLIN-POT CLAVULANATE 875-125 MG PO TABS
1.0000 | ORAL_TABLET | Freq: Two times a day (BID) | ORAL | 0 refills | Status: AC
  Filled 2023-01-20: qty 10, 5d supply, fill #0

## 2023-01-20 MED ORDER — LIDOCAINE-EPINEPHRINE (PF) 2 %-1:200000 IJ SOLN
20.0000 mL | Freq: Once | INTRAMUSCULAR | Status: AC
  Administered 2023-01-20: 20 mL
  Filled 2023-01-20: qty 20

## 2023-01-20 MED ORDER — IBUPROFEN 400 MG PO TABS
400.0000 mg | ORAL_TABLET | Freq: Once | ORAL | Status: AC
  Administered 2023-01-20: 400 mg via ORAL
  Filled 2023-01-20: qty 1

## 2023-01-20 MED ORDER — AMOXICILLIN-POT CLAVULANATE 875-125 MG PO TABS: 1.0000 | ORAL_TABLET | Freq: Two times a day (BID) | ORAL | 0 refills | Status: DC

## 2023-01-20 MED ORDER — ACETAMINOPHEN 500 MG PO TABS
1000.0000 mg | ORAL_TABLET | Freq: Once | ORAL | Status: AC
  Administered 2023-01-20: 1000 mg via ORAL
  Filled 2023-01-20: qty 2

## 2023-01-20 NOTE — ED Triage Notes (Signed)
Right middle finger infection , visible pus at nail base , some swelling noted .

## 2023-01-20 NOTE — Discharge Instructions (Addendum)
It was our pleasure to provide your ER care today - we hope that you feel better.  Keep area very clean. Avoid biting nails. Take acetaminophen or ibuprofen as need.  Take augmentin (antibiotic) as prescribed.   Return if worse, spreading redness, worsening/severe swelling, fevers, or other concern.

## 2023-01-20 NOTE — ED Provider Notes (Signed)
[place] EMERGENCY DEPARTMENT AT [facility] Provider Note   CSN: [id number] Arrival date & time: 01/20/23  1216     History  Chief Complaint  Patient presents with   finger infection    Right middle    Emily Lowery is a 32 y.o. female.  Pt c/o pain and swelling to end of right middle finger in past few days. Denies injury. No fever/chills. No hx same symptoms. Had been biting nail prior to onset symptoms/infection.   The history is provided by the patient and medical records.       Home Medications Prior to Admission medications   Medication Sig Start Date End Date Taking? Authorizing Provider  ibuprofen (ADVIL) 800 MG tablet Take 1 tablet (800 mg total) by mouth every 8 (eight) hours as needed. 12/29/22   Elson Areas, PA-C  methocarbamol (ROBAXIN) 500 MG tablet Take 1 tablet (500 mg total) by mouth 4 (four) times daily. 12/29/22   Elson Areas, PA-C  Multiple Vitamin (MULTIVITAMIN WITH MINERALS) TABS tablet Take 1 tablet by mouth daily.    [provider]      Allergies    Patient has no known allergies.    Review of Systems   Review of Systems  Constitutional:  Negative for fever.  Musculoskeletal:        Finger tip pain/infection    Physical Exam Updated Vital Signs BP (!) 133/51 (BP Location: Right Arm)   Pulse 88   Temp 97.9 F (36.6 C) (Oral)   Resp 14   LMP 01/19/2023   SpO2 100%  Physical Exam Vitals and nursing note reviewed.  Constitutional:      Appearance: Normal appearance. She is well-developed.  HENT:     Head: Atraumatic.  Neck:     Trachea: No tracheal deviation.  Cardiovascular:     Pulses: Normal pulses.  Pulmonary:     Effort: Pulmonary effort is normal. No respiratory distress.  Musculoskeletal:     Cervical back: No muscular tenderness.     Comments: Mild swelling and tenderness to distal tip right middle finger. Exam c/w paronychia.   Skin:    General: Skin is warm and dry.     Findings: No rash.   Neurological:     Mental Status: She is alert.     Comments: Alert, speech normal. Right finger/hand nvi.      ED Results / Procedures / Treatments   Labs (all labs ordered are listed, but only abnormal results are displayed) Labs Reviewed - No data to display  EKG None  Radiology No results found.  Procedures Drain paronychia  Date/Time: 01/20/2023 1:24 PM  Performed by: Cathren Laine, MD Authorized by: Cathren Laine, MD  Consent given by: patient Preparation: Patient was prepped and draped in the usual sterile fashion. Local anesthesia used: yes  Anesthesia: Local anesthesia used: yes Anesthetic total: 3 mL Patient tolerance: patient tolerated the procedure well with no immediate complications Comments: Sterile prep/chlorhexidine/alc, digit block with 2% epi. Excellent anesthesia. Tolerated drainage of paronychia well. Small-med amount pus expressed. Sterile dressing.        Medications Ordered in ED Medications  lidocaine-EPINEPHrine (XYLOCAINE W/EPI) 2 %-1:200000 (PF) injection 20 mL (has no administration in time range)    ED Course/ Medical Decision Making/ A&P                                 Medical Decision Making  Problems Addressed: Paronychia of right middle finger: acute illness or injury  Amount and/or Complexity of Data Reviewed Independent Historian:     Details: Family,hx External Data Reviewed: notes.  Risk OTC drugs. Prescription drug management.   I and D performed.   Reviewed nursing notes and prior charts for additional history.   Ibuprofen, acetaminophen po. Pt tolerated well.   Sterile dressing.         Final Clinical Impression(s) / ED Diagnoses Final diagnoses:  None    Rx / DC Orders ED Discharge Orders     None         Cathren Laine, MD 01/20/23 1331

## 2023-02-09 DIAGNOSIS — Z113 Encounter for screening for infections with a predominantly sexual mode of transmission: Secondary | ICD-10-CM | POA: Diagnosis not present

## 2023-02-09 DIAGNOSIS — N939 Abnormal uterine and vaginal bleeding, unspecified: Secondary | ICD-10-CM | POA: Diagnosis not present

## 2023-02-09 DIAGNOSIS — Z3040 Encounter for surveillance of contraceptives, unspecified: Secondary | ICD-10-CM | POA: Diagnosis not present

## 2023-04-14 ENCOUNTER — Encounter (HOSPITAL_BASED_OUTPATIENT_CLINIC_OR_DEPARTMENT_OTHER): Payer: Self-pay | Admitting: Emergency Medicine

## 2023-04-14 ENCOUNTER — Other Ambulatory Visit: Payer: Self-pay

## 2023-04-14 ENCOUNTER — Emergency Department (HOSPITAL_BASED_OUTPATIENT_CLINIC_OR_DEPARTMENT_OTHER)
Admission: EM | Admit: 2023-04-14 | Discharge: 2023-04-14 | Disposition: A | Discharge status: Home / Self Care | Payer: 59 | Attending: Emergency Medicine | Admitting: Emergency Medicine

## 2023-04-14 DIAGNOSIS — L292 Pruritus vulvae: Secondary | ICD-10-CM | POA: Diagnosis not present

## 2023-04-14 DIAGNOSIS — N898 Other specified noninflammatory disorders of vagina: Secondary | ICD-10-CM | POA: Diagnosis not present

## 2023-04-14 DIAGNOSIS — B3731 Acute candidiasis of vulva and vagina: Secondary | ICD-10-CM | POA: Diagnosis not present

## 2023-04-14 LAB — WET PREP, GENITAL
Clue Cells Wet Prep HPF POC: NONE SEEN
Sperm: NONE SEEN
Trich, Wet Prep: NONE SEEN
WBC, Wet Prep HPF POC: >=10 — AB (ref <10)

## 2023-04-14 LAB — PREGNANCY, URINE: Preg Test, Ur: NEGATIVE

## 2023-04-14 MED ORDER — CLOTRIMAZOLE 1 % EX CREA: TOPICAL_CREAM | CUTANEOUS | 0 refills | Status: AC

## 2023-04-14 NOTE — ED Triage Notes (Signed)
Pt states irrigating in vaginal, denies any new partner, or injury, stats is using a new soap and has Hx BV

## 2023-04-14 NOTE — ED Provider Notes (Signed)
[place] [facility] Provider Note   CSN: [id number] Arrival date & time: 04/14/23  0503     History  Chief Complaint  Patient presents with   Vaginal Itching    Emily Lowery is a 32 y.o. female.  The history is provided by the patient.  Vaginal Itching This is a new problem. The current episode started yesterday. The problem occurs constantly. The problem has not changed since onset.Pertinent negatives include no chest pain, no abdominal pain, no headaches and no shortness of breath. Nothing aggravates the symptoms. Nothing relieves the symptoms. She has tried nothing for the symptoms. The treatment provided no relief.       Home Medications Prior to Admission medications   Medication Sig Start Date End Date Taking? Authorizing Provider  clotrimazole (LOTRIMIN) 1 % cream Apply to affected area 2 times daily 04/14/23  Yes Caitlan Chauca, MD  amoxicillin-clavulanate (AUGMENTIN) 875-125 MG tablet Take 1 tablet by mouth every 12 (twelve) hours. 01/20/23   Cathren Laine, MD  ibuprofen (ADVIL) 800 MG tablet Take 1 tablet (800 mg total) by mouth every 8 (eight) hours as needed. 12/29/22   Elson Areas, PA-C  methocarbamol (ROBAXIN) 500 MG tablet Take 1 tablet (500 mg total) by mouth 4 (four) times daily. 12/29/22   Elson Areas, PA-C  Multiple Vitamin (MULTIVITAMIN WITH MINERALS) TABS tablet Take 1 tablet by mouth daily.    [provider]      Allergies    Patient has no known allergies.    Review of Systems   Review of Systems  Constitutional:  Negative for fever.  Eyes:  Negative for redness.  Respiratory:  Negative for shortness of breath.   Cardiovascular:  Negative for chest pain.  Gastrointestinal:  Negative for abdominal pain.  Genitourinary:  Negative for dysuria, vaginal bleeding and vaginal discharge.  Neurological:  Negative for headaches.  All other systems reviewed and are negative.   Physical Exam Updated Vital  Signs BP 129/78 (BP Location: Right Arm)   Pulse 90   Temp 98.3 F (36.8 C) (Oral)   Resp 16   Ht 5\' 6"  (1.676 m)   Wt 108.9 kg   LMP 03/24/2023 (Exact Date)   SpO2 100%   Breastfeeding No   BMI 38.74 kg/m  Physical Exam Vitals and nursing note reviewed.  Constitutional:      General: She is not in acute distress.    Appearance: She is well-developed.  HENT:     Head: Normocephalic and atraumatic.     Nose: Nose normal.  Eyes:     Pupils: Pupils are equal, round, and reactive to light.  Cardiovascular:     Rate and Rhythm: Normal rate and regular rhythm.     Pulses: Normal pulses.     Heart sounds: Normal heart sounds.  Pulmonary:     Effort: Pulmonary effort is normal. No respiratory distress.     Breath sounds: Normal breath sounds.  Abdominal:     General: Bowel sounds are normal. There is no distension.     Palpations: Abdomen is soft.     Tenderness: There is no abdominal tenderness. There is no guarding or rebound.  Musculoskeletal:        General: Normal range of motion.     Cervical back: Neck supple.  Skin:    General: Skin is dry.     Capillary Refill: Capillary refill takes less than 2 seconds.     Findings: No erythema or  rash.  Neurological:     General: No focal deficit present.     Deep Tendon Reflexes: Reflexes normal.  Psychiatric:        Mood and Affect: Mood normal.     ED Results / Procedures / Treatments   Labs (all labs ordered are listed, but only abnormal results are displayed) Labs Reviewed  WET PREP, GENITAL - Abnormal; Notable for the following components:      Result Value   Yeast Wet Prep HPF POC PRESENT (*)    WBC, Wet Prep HPF POC >=10 (*)    All other components within normal limits  PREGNANCY, URINE  GC/CHLAMYDIA PROBE AMP ([place]) NOT AT [facility]    EKG None  Radiology No results found.  Procedures Procedures    Medications Ordered in ED Medications - No data to display  ED Course/ Medical Decision Making/  A&P                                 Medical Decision Making Patient with one day of vaginal itching.    Amount and/or Complexity of Data Reviewed Labs: ordered.    Details: Wet prep is positive for yeast   Risk Risk Details: Yeast vaginitis.  Will treat with clotrimazole cream.  Stable for discharge with close follow up.      Final Clinical Impression(s) / ED Diagnoses Final diagnoses:  Yeast infection of the vagina   Return for intractable cough, coughing up blood, fevers > 100.4 unrelieved by medication, shortness of breath, intractable vomiting, chest pain, shortness of breath, weakness, numbness, changes in speech, facial asymmetry, abdominal pain, passing out, Inability to tolerate liquids or food, cough, altered mental status or any concerns. No signs of systemic illness or infection. The patient is nontoxic-appearing on exam and vital signs are within normal limits.  I have reviewed the triage vital signs and the nursing notes. Pertinent labs & imaging results that were available during my care of the patient were reviewed by me and considered in my medical decision making (see chart for details). After history, exam, and medical workup I feel the patient has been appropriately medically screened and is safe for discharge home. Pertinent diagnoses were discussed with the patient. Patient was given return precautions.  Rx / DC Orders ED Discharge Orders          Ordered    clotrimazole (LOTRIMIN) 1 % cream        04/14/23 0556              Fransheska Willingham, MD 04/14/23 [phone]

## 2023-04-17 LAB — GC/CHLAMYDIA PROBE AMP (~~LOC~~) NOT AT ARMC
Chlamydia: NEGATIVE
Comment: NEGATIVE
Comment: NORMAL
Neisseria Gonorrhea: NEGATIVE

## 2023-05-18 DIAGNOSIS — Z124 Encounter for screening for malignant neoplasm of cervix: Secondary | ICD-10-CM | POA: Diagnosis not present

## 2023-05-18 DIAGNOSIS — Z01419 Encounter for gynecological examination (general) (routine) without abnormal findings: Secondary | ICD-10-CM | POA: Diagnosis not present

## 2023-05-18 DIAGNOSIS — Z113 Encounter for screening for infections with a predominantly sexual mode of transmission: Secondary | ICD-10-CM | POA: Diagnosis not present

## 2023-09-17 ENCOUNTER — Emergency Department (HOSPITAL_BASED_OUTPATIENT_CLINIC_OR_DEPARTMENT_OTHER)
Admission: EM | Admit: 2023-09-17 | Discharge: 2023-09-17 | Disposition: A | Discharge status: Home / Self Care | Payer: Self-pay | Attending: Emergency Medicine | Admitting: Emergency Medicine

## 2023-09-17 ENCOUNTER — Other Ambulatory Visit: Payer: Self-pay

## 2023-09-17 ENCOUNTER — Encounter (HOSPITAL_BASED_OUTPATIENT_CLINIC_OR_DEPARTMENT_OTHER): Payer: Self-pay

## 2023-09-17 ENCOUNTER — Encounter: Payer: Self-pay | Admitting: Hematology and Oncology

## 2023-09-17 DIAGNOSIS — H1033 Unspecified acute conjunctivitis, bilateral: Secondary | ICD-10-CM | POA: Insufficient documentation

## 2023-09-17 DIAGNOSIS — H109 Unspecified conjunctivitis: Secondary | ICD-10-CM

## 2023-09-17 MED ORDER — OFLOXACIN 0.3 % OP SOLN
1.0000 [drp] | OPHTHALMIC | Status: DC
  Administered 2023-09-17: 1 [drp] via OPHTHALMIC
  Filled 2023-09-17: qty 5

## 2023-09-17 NOTE — Discharge Instructions (Signed)
 Take a Claritin or Zyrtec daily for potential allergic conjunctivitis.  Use the antibiotics for 5 days as instructed.

## 2023-09-17 NOTE — ED Provider Notes (Signed)
[place] [facility] Provider Note   CSN: [id number] Arrival date & time: 09/17/23  8295     History  Chief Complaint  Patient presents with   Eye Drainage    Redness to left eye with crust formation and drainage x several days.     Emily Lowery is a 33 y.o. female.  Presents with 2 days of redness, irritation of both of her eyes with drainage.  She woke up this morning with crusting.       Home Medications Prior to Admission medications   Medication Sig Start Date End Date Taking? Authorizing Provider  amoxicillin-clavulanate (AUGMENTIN) 875-125 MG tablet Take 1 tablet by mouth every 12 (twelve) hours. 01/20/23   Cathren Laine, MD  clotrimazole (LOTRIMIN) 1 % cream Apply to affected area 2 times daily 04/14/23   Palumbo, April, MD  ibuprofen (ADVIL) 800 MG tablet Take 1 tablet (800 mg total) by mouth every 8 (eight) hours as needed. 12/29/22   Elson Areas, PA-C  methocarbamol (ROBAXIN) 500 MG tablet Take 1 tablet (500 mg total) by mouth 4 (four) times daily. 12/29/22   Elson Areas, PA-C  Multiple Vitamin (MULTIVITAMIN WITH MINERALS) TABS tablet Take 1 tablet by mouth daily.    [provider]      Allergies    Patient has no known allergies.    Review of Systems   Review of Systems  Physical Exam Updated Vital Signs Pulse (!) 105   Resp 17   Ht 5\' 6"  (1.676 m)   Wt 108.9 kg   SpO2 99%   BMI 38.74 kg/m  Physical Exam Constitutional:      Appearance: Normal appearance.  HENT:     Head: Normocephalic and atraumatic.  Eyes:     General: Lids are normal. Vision grossly intact. Gaze aligned appropriately.     Extraocular Movements: Extraocular movements intact.     Conjunctiva/sclera:     Right eye: Right conjunctiva is injected. No chemosis, exudate or hemorrhage.    Left eye: Left conjunctiva is injected. No chemosis, exudate or hemorrhage. Skin:    Findings: No erythema or rash.  Neurological:     Mental  Status: She is alert and oriented to person, place, and time.     Cranial Nerves: Cranial nerves 2-12 are intact.     Sensory: Sensation is intact.     Motor: Motor function is intact.     ED Results / Procedures / Treatments   Labs (all labs ordered are listed, but only abnormal results are displayed) Labs Reviewed - No data to display  EKG None  Radiology No results found.  Procedures Procedures    Medications Ordered in ED Medications  ofloxacin (OCUFLOX) 0.3 % ophthalmic solution 1 drop (has no administration in time range)    ED Course/ Medical Decision Making/ A&P                                 Medical Decision Making  With bilateral conjunctival injection.  Treat for possible allergic with Claritin daily, but will also treat as presumed infectious conjunctivitis with ofloxacin.        Final Clinical Impression(s) / ED Diagnoses Final diagnoses:  Conjunctivitis of both eyes, unspecified conjunctivitis type    Rx / DC Orders ED Discharge Orders     None         Jisell Majer, Canary Brim, MD 09/17/23  0234  

## 2023-09-17 NOTE — ED Notes (Signed)
 Patient to take drops every 4 hours for five days as instructed.

## 2023-12-17 IMAGING — DX DG CHEST 2V
2 series · 2 of 2 positions shown · non-contrast
Comparison: Chest radiograph dated December 05, 2014.

CLINICAL DATA: Chest pain

EXAM:
CHEST - 2 VIEW

[chest pa]
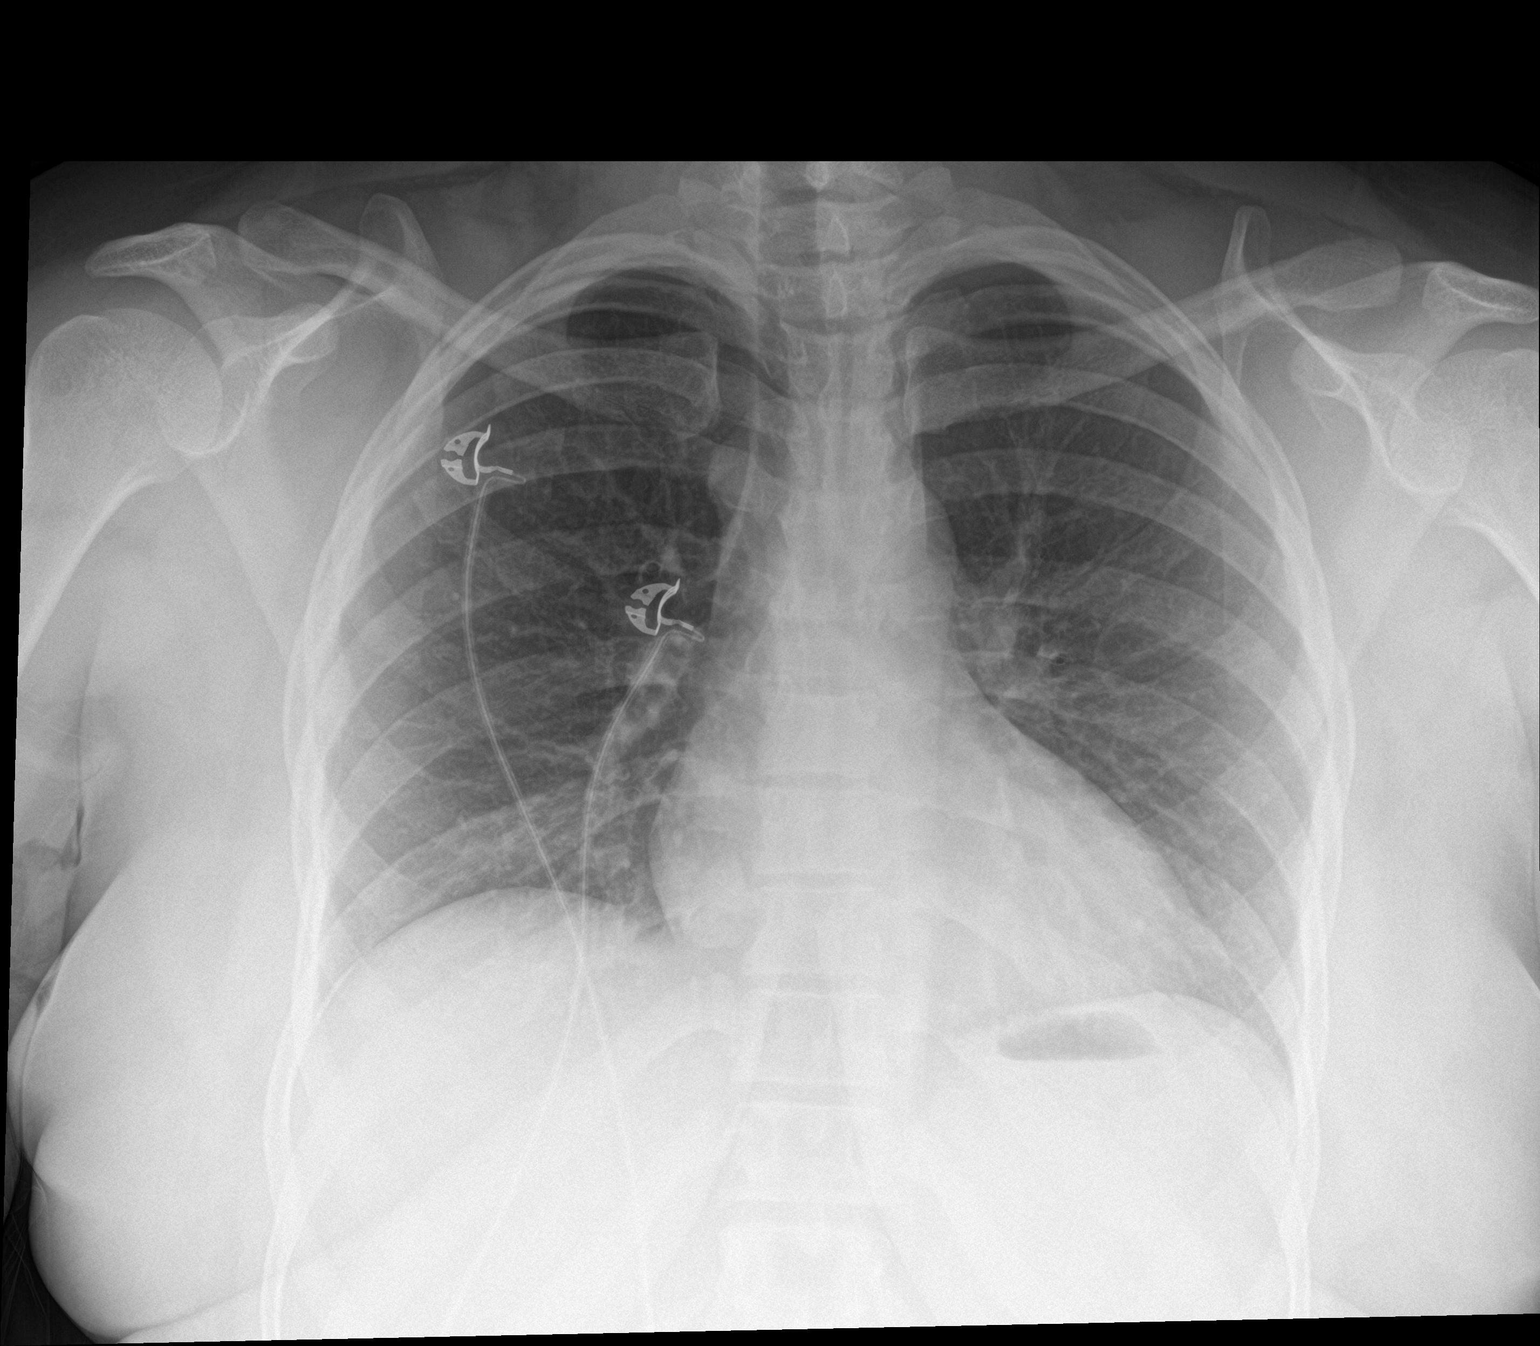

[chest lat]
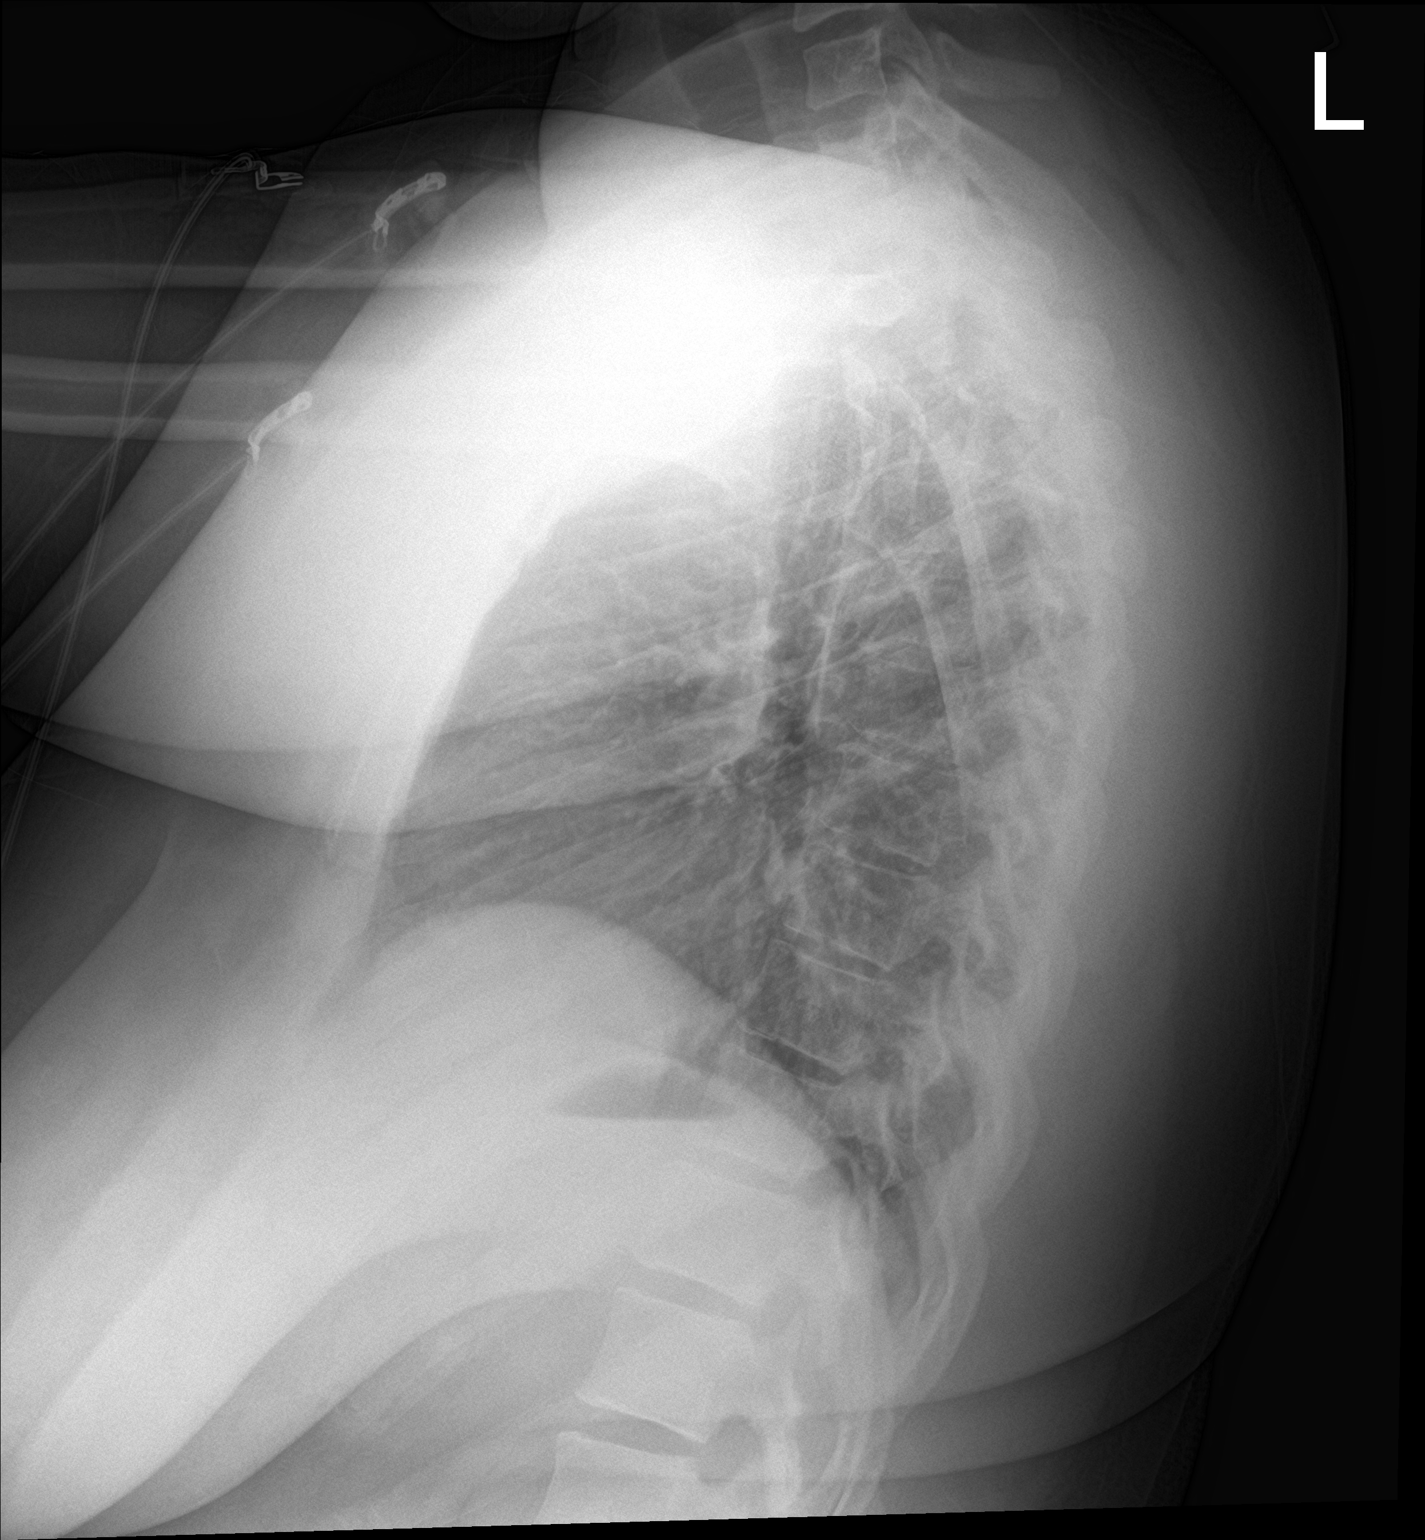

[2 of 2 positions shown; findings below may reference images not displayed]

FINDINGS: The heart size and mediastinal contours are within normal limits.
Both lungs are clear. The visualized skeletal structures are
unremarkable.
IMPRESSION: No active cardiopulmonary disease.

## 2024-01-07 ENCOUNTER — Encounter (HOSPITAL_BASED_OUTPATIENT_CLINIC_OR_DEPARTMENT_OTHER): Payer: Self-pay

## 2024-01-07 ENCOUNTER — Other Ambulatory Visit: Payer: Self-pay

## 2024-01-07 ENCOUNTER — Emergency Department (HOSPITAL_BASED_OUTPATIENT_CLINIC_OR_DEPARTMENT_OTHER)
Admission: EM | Admit: 2024-01-07 | Discharge: 2024-01-07 | Disposition: A | Discharge status: Home / Self Care | Payer: Self-pay | Attending: Emergency Medicine | Admitting: Emergency Medicine

## 2024-01-07 ENCOUNTER — Emergency Department (HOSPITAL_BASED_OUTPATIENT_CLINIC_OR_DEPARTMENT_OTHER): Payer: Self-pay

## 2024-01-07 DIAGNOSIS — R109 Unspecified abdominal pain: Secondary | ICD-10-CM

## 2024-01-07 DIAGNOSIS — R1031 Right lower quadrant pain: Secondary | ICD-10-CM | POA: Insufficient documentation

## 2024-01-07 DIAGNOSIS — X501XXA Overexertion from prolonged static or awkward postures, initial encounter: Secondary | ICD-10-CM | POA: Insufficient documentation

## 2024-01-07 DIAGNOSIS — R110 Nausea: Secondary | ICD-10-CM

## 2024-01-07 LAB — CBC WITH DIFFERENTIAL/PLATELET
Abs Immature Granulocytes: 0.01 K/uL (ref 0.00–0.07)
Basophils Absolute: 0 K/uL (ref 0.0–0.1)
Basophils Relative: 1 %
Eosinophils Absolute: 0.1 K/uL (ref 0.0–0.5)
Eosinophils Relative: 1 %
HCT: 40.1 % (ref 36.0–46.0)
Hemoglobin: 13 g/dL (ref 12.0–15.0)
Immature Granulocytes: 0 %
Lymphocytes Relative: 44 %
Lymphs Abs: 3.2 K/uL (ref 0.7–4.0)
MCH: 27 pg (ref 26.0–34.0)
MCHC: 32.4 g/dL (ref 30.0–36.0)
MCV: 83.2 fL (ref 80.0–100.0)
Monocytes Absolute: 0.5 K/uL (ref 0.1–1.0)
Monocytes Relative: 6 %
Neutro Abs: 3.6 K/uL (ref 1.7–7.7)
Neutrophils Relative %: 48 %
Platelets: 413 K/uL — ABNORMAL HIGH (ref 150–400)
RBC: 4.82 MIL/uL (ref 3.87–5.11)
RDW: 15 % (ref 11.5–15.5)
WBC: 7.4 K/uL (ref 4.0–10.5)
nRBC: 0 % (ref 0.0–0.2)

## 2024-01-07 LAB — COMPREHENSIVE METABOLIC PANEL WITH GFR
ALT: 13 U/L (ref 0–44)
AST: 15 U/L (ref 15–41)
Albumin: 4.2 g/dL (ref 3.5–5.0)
Alkaline Phosphatase: 66 U/L (ref 38–126)
Anion gap: 13 (ref 5–15)
BUN: 15 mg/dL (ref 6–20)
CO2: 22 mmol/L (ref 22–32)
Calcium: 9.7 mg/dL (ref 8.9–10.3)
Chloride: 103 mmol/L (ref 98–111)
Creatinine, Ser: 0.74 mg/dL (ref 0.44–1.00)
GFR, Estimated: >60 mL/min (ref >60)
Glucose, Bld: 114 mg/dL — ABNORMAL HIGH (ref 70–99)
Potassium: 4.2 mmol/L (ref 3.5–5.1)
Sodium: 137 mmol/L (ref 135–145)
Total Bilirubin: 0.2 mg/dL (ref 0.0–1.2)
Total Protein: 7.9 g/dL (ref 6.5–8.1)

## 2024-01-07 LAB — URINALYSIS, ROUTINE W REFLEX MICROSCOPIC
Bilirubin Urine: NEGATIVE
Glucose, UA: NEGATIVE mg/dL
Ketones, ur: NEGATIVE mg/dL
Leukocytes,Ua: NEGATIVE
Nitrite: NEGATIVE
Protein, ur: NEGATIVE mg/dL
Specific Gravity, Urine: 1.025 (ref 1.005–1.030)
pH: 6 (ref 5.0–8.0)

## 2024-01-07 LAB — URINALYSIS, MICROSCOPIC (REFLEX)

## 2024-01-07 LAB — HCG, SERUM, QUALITATIVE: Preg, Serum: NEGATIVE

## 2024-01-07 LAB — LIPASE, BLOOD: Lipase: 42 U/L (ref 11–51)

## 2024-01-07 MED ORDER — FENTANYL CITRATE PF 50 MCG/ML IJ SOSY
50.0000 ug | PREFILLED_SYRINGE | Freq: Once | INTRAMUSCULAR | Status: AC
  Administered 2024-01-07: 50 ug via INTRAVENOUS
  Filled 2024-01-07: qty 1

## 2024-01-07 MED ORDER — KETOROLAC TROMETHAMINE 30 MG/ML IJ SOLN
30.0000 mg | Freq: Once | INTRAMUSCULAR | Status: AC
  Administered 2024-01-07: 30 mg via INTRAVENOUS
  Filled 2024-01-07: qty 1

## 2024-01-07 MED ORDER — ONDANSETRON HCL 4 MG/2ML IJ SOLN
4.0000 mg | Freq: Once | INTRAMUSCULAR | Status: AC
  Administered 2024-01-07: 4 mg via INTRAVENOUS
  Filled 2024-01-07: qty 2

## 2024-01-07 MED ORDER — IOHEXOL 300 MG/ML  SOLN
100.0000 mL | Freq: Once | INTRAMUSCULAR | Status: AC | PRN
  Administered 2024-01-07: 100 mL via INTRAVENOUS

## 2024-01-07 MED ORDER — ONDANSETRON 4 MG PO TBDP: 4.0000 mg | ORAL_TABLET | Freq: Three times a day (TID) | ORAL | 0 refills | Status: AC | PRN

## 2024-01-07 NOTE — ED Triage Notes (Signed)
 Pt is having sharp pain her right lower abdominal area. States that she turned and the pain began. Denies nausea and vomiting. States that she did smoke marijuana yesterday.

## 2024-01-07 NOTE — ED Provider Notes (Signed)
 [place] [facility] Provider Note   CSN: [id number] Arrival date & time: 01/07/24  9163     Patient presents with: Abdominal Pain   Copeland Lapier is a 33 y.o. female.   Patient here with right lower abdominal pain that started after she twisted funny in bed this morning.  Does admit to some marijuana yesterday.  Feels a little nauseous but has not vomited.  She felt she needed to go to the bathroom but has not.  She denies any vaginal discharge or bleeding.  She has no concern for STDs.  She denies any weakness numbness tingling.  She denies any back pain.  She denies any surgeries to the abdomen in the past.  She has a history of allergies.  She denies any chest pain shortness of breath.  Denies any pain with urination.  The history is provided by the patient.       Prior to Admission medications   Medication Sig Start Date End Date Taking? Authorizing Provider  ondansetron  (ZOFRAN -ODT) 4 MG disintegrating tablet Take 1 tablet (4 mg total) by mouth every 8 (eight) hours as needed. 01/07/24  Yes Simeon Vera, DO  amoxicillin -clavulanate (AUGMENTIN ) 875-125 MG tablet Take 1 tablet by mouth every 12 (twelve) hours. 01/20/23   Steinl, Kevin, MD  clotrimazole  (LOTRIMIN ) 1 % cream Apply to affected area 2 times daily 04/14/23   Palumbo, April, MD  ibuprofen  (ADVIL ) 800 MG tablet Take 1 tablet (800 mg total) by mouth every 8 (eight) hours as needed. 12/29/22   Sofia, Leslie K, PA-C  methocarbamol  (ROBAXIN ) 500 MG tablet Take 1 tablet (500 mg total) by mouth 4 (four) times daily. 12/29/22   Sofia, Leslie K, PA-C  Multiple Vitamin (MULTIVITAMIN WITH MINERALS) TABS tablet Take 1 tablet by mouth daily.    [provider]    Allergies: Patient has no known allergies.    Review of Systems  Updated Vital Signs BP (!) 90/50 Comment: patient is laying on side  Pulse 95   Temp (!) 97.4 F (36.3 C) (Oral)   Resp 18   Ht 5' 6 (1.676 m)   Wt 108.9 kg    LMP 12/12/2023 (Approximate)   SpO2 100%   BMI 38.74 kg/m   Physical Exam Vitals and nursing note reviewed.  Constitutional:      General: She is not in acute distress.    Appearance: She is well-developed. She is not ill-appearing.  HENT:     Head: Normocephalic and atraumatic.     Nose: Nose normal.     Mouth/Throat:     Mouth: Mucous membranes are moist.  Eyes:     Extraocular Movements: Extraocular movements intact.     Conjunctiva/sclera: Conjunctivae normal.     Pupils: Pupils are equal, round, and reactive to light.  Cardiovascular:     Rate and Rhythm: Normal rate and regular rhythm.     Pulses: Normal pulses.     Heart sounds: Normal heart sounds. No murmur heard. Pulmonary:     Effort: Pulmonary effort is normal. No respiratory distress.     Breath sounds: Normal breath sounds.  Abdominal:     General: Abdomen is flat.     Palpations: Abdomen is soft.     Tenderness: There is abdominal tenderness in the right lower quadrant and suprapubic area.  Musculoskeletal:        General: No swelling. Normal range of motion.     Cervical back: Normal range of motion and  neck supple.  Skin:    General: Skin is warm and dry.     Capillary Refill: Capillary refill takes less than 2 seconds.  Neurological:     General: No focal deficit present.     Mental Status: She is alert and oriented to person, place, and time.     Cranial Nerves: No cranial nerve deficit.     Sensory: No sensory deficit.     Motor: No weakness.     Coordination: Coordination normal.  Psychiatric:        Mood and Affect: Mood normal.     (all labs ordered are listed, but only abnormal results are displayed) Labs Reviewed  URINALYSIS, ROUTINE W REFLEX MICROSCOPIC - Abnormal; Notable for the following components:      Result Value   Hgb urine dipstick TRACE (*)    All other components within normal limits  CBC WITH DIFFERENTIAL/PLATELET - Abnormal; Notable for the following components:    Platelets 413 (*)    All other components within normal limits  COMPREHENSIVE METABOLIC PANEL WITH GFR - Abnormal; Notable for the following components:   Glucose, Bld 114 (*)    All other components within normal limits  URINALYSIS, MICROSCOPIC (REFLEX) - Abnormal; Notable for the following components:   Bacteria, UA FEW (*)    All other components within normal limits  LIPASE, BLOOD  HCG, SERUM, QUALITATIVE    EKG: None  Radiology: CT ABDOMEN PELVIS W CONTRAST Result Date: 01/07/2024 CLINICAL DATA:  Acute non localized abdominal pain. EXAM: CT ABDOMEN AND PELVIS WITH CONTRAST TECHNIQUE: Multidetector CT imaging of the abdomen and pelvis was performed using the standard protocol following bolus administration of intravenous contrast. RADIATION DOSE REDUCTION: This exam was performed according to the departmental dose-optimization program which includes automated exposure control, adjustment of the mA and/or kV according to patient size and/or use of iterative reconstruction technique. CONTRAST:  OMNIPAQUE  IOHEXOL  300 MG/ML  SOLN COMPARISON:  08/03/2020 FINDINGS: Lower Chest: No acute findings. Hepatobiliary: No suspicious hepatic masses identified. Gallbladder is unremarkable. No evidence of biliary ductal dilatation. Pancreas:  No mass or inflammatory changes. Spleen: Within normal limits in size and appearance. Adrenals/Urinary Tract: No suspicious masses identified. No evidence of ureteral calculi or hydronephrosis. Stomach/Bowel: No evidence of obstruction, inflammatory process or abnormal fluid collections. Normal appendix visualized. Vascular/Lymphatic: No pathologically enlarged lymph nodes. No acute vascular findings. Reproductive:  No mass or other significant abnormality. Other:  None. Musculoskeletal:  No suspicious bone lesions identified. IMPRESSION: Negative. No acute findings or other significant abnormality. Electronically Signed   By: Norleen DELENA Kil M.D.   On: 01/07/2024 10:38      Procedures   Medications Ordered in the ED  fentaNYL  (SUBLIMAZE ) injection 50 mcg (50 mcg Intravenous Given 01/07/24 0858)  ondansetron  (ZOFRAN ) injection 4 mg (4 mg Intravenous Given 01/07/24 0857)  iohexol  (OMNIPAQUE ) 300 MG/ML solution 100 mL (100 mLs Intravenous Contrast Given 01/07/24 0945)  ketorolac  (TORADOL ) 30 MG/ML injection 30 mg (30 mg Intravenous Given 01/07/24 1121)                                    Medical Decision Making Amount and/or Complexity of Data Reviewed Labs: ordered. Radiology: ordered.  Risk Prescription drug management.   Evangelynn Lochridge is here with lower abdominal pain.  Normal vitals.  No fever.  Differential diagnosis UTI versus colitis versus appendicitis versus kidney stone versus UTI.  She  denies any vaginal discharge or bleeding.  Will evaluate for pregnancy.Clinically have lower suspicion for ovarian torsion/cyst.  Will get CBC CMP lipase urinalysis pregnancy test CT scan abdomen and pelvis will give IV fentanyl  IV Zofran  and reevaluate.  Per my review and interpretation labs no significant leukocytosis anemia or electrolyte abnormality.  Urinalysis negative for infection.  CT scan of the abdomen and pelvis with no acute findings per radiology report.  Patient still uncomfortable on repeat exam.  Will give her a dose of Toradol .  I talked about pursuing further pelvic workup.  She does not have an ovarian cyst on the CT scan but could be reasonable to do pelvic exam to look for STD BV and do an ultrasound to evaluate for torsion.  At this time she does not really want to pursue that workup.  Will give her Toradol  reevaluate her and reengage her about this workup.  Overall she is feeling much better after Toradol .  She at this time declines pelvic exam and pelvic ultrasound.  She understands strict return precautions.  Discharged in good condition.  This chart was dictated using voice recognition software.  Despite best efforts to proofread,  errors  can occur which can change the documentation meaning.      Final diagnoses:  Abdominal pain, unspecified abdominal location  Nausea    ED Discharge Orders          Ordered    ondansetron  (ZOFRAN -ODT) 4 MG disintegrating tablet  Every 8 hours PRN        01/07/24 1206               Ruthe Cornet, DO 01/07/24 1207

## 2024-01-07 NOTE — ED Notes (Signed)
 ED Provider at bedside.

## 2024-01-07 NOTE — Discharge Instructions (Signed)
 As we discussed if your pain intensifies or gets worse please return for evaluation of likely pelvic exam and pelvic ultrasound.  I prescribed you Zofran  to take as needed for nausea.  Recommend 400 mg ibuprofen  every 8 hours as needed for pain.  Recommend 1000 mg of Tylenol  every 6 hours as needed for pain.

## 2024-04-27 ENCOUNTER — Encounter (HOSPITAL_BASED_OUTPATIENT_CLINIC_OR_DEPARTMENT_OTHER): Payer: Self-pay

## 2024-04-27 ENCOUNTER — Emergency Department (HOSPITAL_BASED_OUTPATIENT_CLINIC_OR_DEPARTMENT_OTHER)
Admission: EM | Admit: 2024-04-27 | Discharge: 2024-04-27 | Disposition: A | Discharge status: Home / Self Care | Payer: Self-pay | Attending: Emergency Medicine | Admitting: Emergency Medicine

## 2024-04-27 DIAGNOSIS — R509 Fever, unspecified: Secondary | ICD-10-CM | POA: Insufficient documentation

## 2024-04-27 DIAGNOSIS — J029 Acute pharyngitis, unspecified: Secondary | ICD-10-CM | POA: Insufficient documentation

## 2024-04-27 LAB — GROUP A STREP BY PCR: Group A Strep by PCR: NOT DETECTED

## 2024-04-27 LAB — SARS CORONAVIRUS 2 BY RT PCR: SARS Coronavirus 2 by RT PCR: NEGATIVE

## 2024-04-27 MED ORDER — IBUPROFEN 400 MG PO TABS
600.0000 mg | ORAL_TABLET | Freq: Once | ORAL | Status: AC
  Administered 2024-04-27: 600 mg via ORAL
  Filled 2024-04-27: qty 1

## 2024-04-27 MED ORDER — LIDOCAINE VISCOUS HCL 2 % MT SOLN
15.0000 mL | Freq: Once | OROMUCOSAL | Status: AC
  Administered 2024-04-27: 15 mL via OROMUCOSAL
  Filled 2024-04-27: qty 15

## 2024-04-27 NOTE — ED Provider Notes (Signed)
 [place] [facility]  Provider Note  CSN: [id number] Arrival date & time: 04/27/24 9447  History Chief Complaint  Patient presents with   Sore Throat    Emily Lowery is a 33 y.o. female here with sore throat for the last 3-4 days, initially had fever which has resolved but still hurts to swallow. No cough or congestion.    Home Medications Prior to Admission medications   Medication Sig Start Date End Date Taking? Authorizing Provider  amoxicillin -clavulanate (AUGMENTIN ) 875-125 MG tablet Take 1 tablet by mouth every 12 (twelve) hours. 01/20/23   Steinl, Kevin, MD  clotrimazole  (LOTRIMIN ) 1 % cream Apply to affected area 2 times daily 04/14/23   Palumbo, April, MD  ibuprofen  (ADVIL ) 800 MG tablet Take 1 tablet (800 mg total) by mouth every 8 (eight) hours as needed. 12/29/22   Sofia, Leslie K, PA-C  methocarbamol  (ROBAXIN ) 500 MG tablet Take 1 tablet (500 mg total) by mouth 4 (four) times daily. 12/29/22   Sofia, Leslie K, PA-C  Multiple Vitamin (MULTIVITAMIN WITH MINERALS) TABS tablet Take 1 tablet by mouth daily.    [provider]  ondansetron  (ZOFRAN -ODT) 4 MG disintegrating tablet Take 1 tablet (4 mg total) by mouth every 8 (eight) hours as needed. 01/07/24   Ruthe Cornet, DO     Allergies    Patient has no known allergies.   Review of Systems   Review of Systems Please see HPI for pertinent positives and negatives  Physical Exam BP 108/69   Pulse 92   Temp 98 F (36.7 C) (Oral)   Resp 18   SpO2 98%   Physical Exam Vitals and nursing note reviewed.  Constitutional:      Appearance: Normal appearance.  HENT:     Head: Normocephalic and atraumatic.     Nose: Nose normal.     Mouth/Throat:     Mouth: Mucous membranes are moist.     Pharynx: Posterior oropharyngeal erythema present. No oropharyngeal exudate.     Tonsils: No tonsillar exudate or tonsillar abscesses.  Eyes:     Extraocular Movements: Extraocular  movements intact.     Conjunctiva/sclera: Conjunctivae normal.  Cardiovascular:     Rate and Rhythm: Normal rate.  Pulmonary:     Effort: Pulmonary effort is normal.     Breath sounds: Normal breath sounds.  Abdominal:     General: Abdomen is flat.     Palpations: Abdomen is soft.     Tenderness: There is no abdominal tenderness.  Musculoskeletal:        General: No swelling. Normal range of motion.     Cervical back: Neck supple.  Lymphadenopathy:     Cervical: No cervical adenopathy.  Skin:    General: Skin is warm and dry.  Neurological:     General: No focal deficit present.     Mental Status: She is alert.  Psychiatric:        Mood and Affect: Mood normal.     ED Results / Procedures / Treatments   EKG None  Procedures Procedures  Medications Ordered in the ED Medications  ibuprofen  (ADVIL ) tablet 600 mg (has no administration in time range)  lidocaine  (XYLOCAINE ) 2 % viscous mouth solution 15 mL (has no administration in time range)    Initial Impression and Plan  Patient here with sore throat, reported fever earlier in the week. Will check swabs.   ED Course   Clinical Course as of 04/27/24 [phone]  Sat  Apr 27, 2024  9365 Strep is neg.  [CS]  [phone] Covid is neg. Will give a dose of viscous lidocaine  for comfort and recommend OTC supportive care at home. PCP follow up, RTED for any other concerns.   [CS]    Clinical Course User Index [CS] Roselyn Carlin NOVAK, MD     MDM Rules/Calculators/A&P Medical Decision Making Problems Addressed: Viral pharyngitis: acute illness or injury  Amount and/or Complexity of Data Reviewed Labs: ordered. Decision-making details documented in ED Course.  Risk OTC drugs. Prescription drug management.     Final Clinical Impression(s) / ED Diagnoses Final diagnoses:  Viral pharyngitis    Rx / DC Orders ED Discharge Orders     None        Roselyn Carlin NOVAK, MD 04/27/24 [phone]

## 2024-04-27 NOTE — ED Notes (Signed)
Patient verbalizes understanding of discharge instructions. Opportunity for questioning and answers were provided. Armband removed by staff, pt discharged from ED. Ambulated out to lobby  

## 2024-04-27 NOTE — ED Triage Notes (Signed)
 Pt states that she has been having a sore throat since Thursday morning accompanied with fevers
